# Patient Record
Sex: Female | Born: 1960 | Race: White | Hispanic: No | Marital: Married | Smoking: Former smoker
Health system: Southern US, Community
[De-identification: ages and names within clinical notes are randomized; demographics above are authoritative.]

## PROBLEM LIST (undated history)

## (undated) DIAGNOSIS — R109 Unspecified abdominal pain: Secondary | ICD-10-CM

## (undated) DIAGNOSIS — N898 Other specified noninflammatory disorders of vagina: Secondary | ICD-10-CM

## (undated) DIAGNOSIS — R3129 Other microscopic hematuria: Secondary | ICD-10-CM

## (undated) DIAGNOSIS — R319 Hematuria, unspecified: Secondary | ICD-10-CM

## (undated) DIAGNOSIS — K219 Gastro-esophageal reflux disease without esophagitis: Secondary | ICD-10-CM

## (undated) DIAGNOSIS — G4730 Sleep apnea, unspecified: Secondary | ICD-10-CM

## (undated) DIAGNOSIS — E663 Overweight: Secondary | ICD-10-CM

## (undated) DIAGNOSIS — I10 Essential (primary) hypertension: Secondary | ICD-10-CM

## (undated) DIAGNOSIS — R102 Pelvic and perineal pain: Secondary | ICD-10-CM

## (undated) DIAGNOSIS — N200 Calculus of kidney: Secondary | ICD-10-CM

## (undated) HISTORY — DX: Unspecified abdominal pain: R10.9

## (undated) HISTORY — DX: Hematuria, unspecified: R31.9

## (undated) HISTORY — DX: Gastro-esophageal reflux disease without esophagitis: K21.9

## (undated) HISTORY — DX: Other specified noninflammatory disorders of vagina: N89.8

## (undated) HISTORY — DX: Other microscopic hematuria: R31.29

## (undated) HISTORY — DX: Essential (primary) hypertension: I10

## (undated) HISTORY — DX: Pelvic and perineal pain: R10.2

## (undated) HISTORY — DX: Calculus of kidney: N20.0

## (undated) HISTORY — DX: Sleep apnea, unspecified: G47.30

## (undated) HISTORY — PX: LAPAROSCOPIC HYSTERECTOMY: SHX1926

## (undated) HISTORY — DX: Overweight: E66.3

## (undated) HISTORY — PX: KIDNEY STONE SURGERY: SHX686

---

## 1998-03-16 ENCOUNTER — Other Ambulatory Visit: Admission: RE | Admit: 1998-03-16 | Discharge: 1998-03-16 | Payer: Self-pay | Admitting: Obstetrics & Gynecology

## 1999-06-28 ENCOUNTER — Encounter: Admission: RE | Admit: 1999-06-28 | Discharge: 1999-06-28 | Payer: Self-pay | Admitting: Obstetrics and Gynecology

## 1999-06-28 ENCOUNTER — Encounter: Payer: Self-pay | Admitting: Obstetrics and Gynecology

## 1999-12-18 ENCOUNTER — Encounter (INDEPENDENT_AMBULATORY_CARE_PROVIDER_SITE_OTHER): Payer: Self-pay

## 1999-12-18 ENCOUNTER — Inpatient Hospital Stay (HOSPITAL_COMMUNITY): Admission: RE | Admit: 1999-12-18 | Discharge: 1999-12-20 | Payer: Self-pay | Admitting: Obstetrics and Gynecology

## 2001-12-25 ENCOUNTER — Ambulatory Visit (HOSPITAL_BASED_OUTPATIENT_CLINIC_OR_DEPARTMENT_OTHER): Admission: RE | Admit: 2001-12-25 | Discharge: 2001-12-25 | Payer: Self-pay | Admitting: Family Medicine

## 2002-05-04 ENCOUNTER — Ambulatory Visit (HOSPITAL_BASED_OUTPATIENT_CLINIC_OR_DEPARTMENT_OTHER): Admission: RE | Admit: 2002-05-04 | Discharge: 2002-05-04 | Payer: Self-pay | Admitting: Family Medicine

## 2002-10-22 ENCOUNTER — Emergency Department (HOSPITAL_COMMUNITY): Admission: EM | Admit: 2002-10-22 | Discharge: 2002-10-22 | Payer: Self-pay | Admitting: Emergency Medicine

## 2002-10-22 ENCOUNTER — Encounter: Payer: Self-pay | Admitting: Emergency Medicine

## 2008-11-11 ENCOUNTER — Encounter: Admission: RE | Admit: 2008-11-11 | Discharge: 2008-11-11 | Payer: Self-pay | Admitting: Family Medicine

## 2008-11-16 ENCOUNTER — Encounter: Admission: RE | Admit: 2008-11-16 | Discharge: 2008-11-16 | Payer: Self-pay | Admitting: Family Medicine

## 2010-10-12 NOTE — Discharge Summary (Signed)
[facility]  Patient:    Andrea Odom, Andrea Odom                        MRN: [id number] Adm. Date:  [id number] Disc. Date: [id number] Attending:  Oliver Pila                           Discharge Summary  DISCHARGE MEDICATIONS: 1. Motrin 600 mg p.o. every six hours. 2. Percocet one to two tablets p.o. every four hours p.r.n.  DISCHARGE FOLLOWUP:  Patient is to follow up in our office on December 21, 1999 for staple removal.  HOSPITAL COURSE:  Patient is a 50 year old female who was admitted with a diagnosis of dysmenorrhea, menorrhagia and pelvic pain, who also had some anemia associated with her menstrual cycles.  She desired to proceed with definitive surgical therapy, given probable fibroid uterus versus adenomyosis. She underwent a total abdominal hysterectomy and bilateral salpingo-oophorectomy on December 18, 1999 without complication and was admitted for routine postoperative care.  She did very well postoperatively and on postop day #2, was tolerating a regular diet, passing good flatus, voiding without difficulty and her pain was well-controlled.  Her T-max was 100.1. Her incision was well-approximated without erythema; therefore, she requested to be discharged home if she remained afebrile.  On hospital day #2, she was discharged to home with followup as previously stated in the office for staple removal. DD:  12/20/99 TD:  12/22/99 Job: [id number]

## 2010-10-12 NOTE — Op Note (Signed)
[facility] [place]  Patient:    Andrea Odom, Andrea Odom                        MRN: [id number] Proc. Date: 12/18/99 Adm. Date:  [id number] Attending:  Oliver Pila                           Operative Report  PREOPERATIVE DIAGNOSES:       1. Dysmenorrhea.                               2. Menorrhagia.                               3. Pelvic pain.                               4. Anemia.  POSTOPERATIVE DIAGNOSES:      1. Dysmenorrhea.                               2. Menorrhagia.                               3. Pelvic pain.                               4. Anemia.  OPERATION:                    1. Total abdominal hysterectomy.                               2. Bilateral salpingo-oophorectomy.  SURGEON:                      Alvino Chapel, M.D.  ASSISTANT:                    Malachi Pro. Ambrose Mantle, M.D.  ANESTHESIA:                   General endotracheal.  ESTIMATED BLOOD LOSS:         350 cc  URINE OUTPUT:                 Approximately 200 cc clear urine  IV FLUIDS:                    1700 cc lactated Ringers.  FINDINGS:                     The right ovary was within normal limits. Fallopian tubes bilaterally were slightly adhesed.  The left ovary had an approximately 2 cm simple cyst, otherwise was within normal limits.  Uterus was approximately 8 to 10 weeks size.  There was some scarring of the bladder flap onto the anterior surface of the uterus.  DESCRIPTION OF PROCEDURE:     The patient was taken to the operating room where general anesthesia was obtained without difficulty.  She was then prepped and draped in the normal sterile fashion in  the dorsal supine position.  Transverse incision was made on the abdomen, approximately 2 cm above the symphysis pubis and carried through to the underlying layer of fascia by sharp dissection and Bovie cautery.  The fascia was then nicked in the midline with Bovie and extended laterally with Mayo scissors.   The superior aspect of the incision was grasped with Kocher clamps, elevated and the underlying rectus muscles were dissected away with Bovie cautery.  On the patients right, the rectus muscle was quite adherent to the fascia from her previous surgery.  The inferior aspect of the fascia was then grasped with Kocher clamps, elevated and dissected off to underlying rectus muscles.  The abdominal cavity was then entered bluntly between the rectus muscles and the peritoneal incision extended both superiorly and inferiorly with careful attention to avoid the bowel or bladder.  The Balfour retractor was then placed within the incision and the bowel packed away with moist laparotomy sponges.  Pelvis was inspected with findings as previously stated.  The slightly curved long Kellys were placed on each uterine cornu and the uterus elevated to the level of the incision.  Attention was turned to the patients left round ligament which was transected with Bovie cautery and the retroperitoneal space opened.  This was carried over to the midline of the uterus to begin to create a bladder flap.  The infundibulopelvic ligament was then isolated and slightly curved up and clamp placed on it. This was then transected and secured with both free tie and suture ligature of 0 Vicryl. Attention was then turned to the patients right where in a similar fashion, the round ligament was transected with Bovie cautery.  Retroperitoneal space opened.  The infundibulopelvic ligament was isolated, clamped with a slightly curved clamp and transected.  This was then likewise secured with free tie and suture ligature of 0 Vicryl.  The uterine artery was then skeletonized on the patients right and some remaining scar tissue of the bladder flap to the lower uterine segment was taken down with Metzenbaum scissors.  The uterine artery on the patients right was skeletonized in a similar fashion.  The uterine arteries were then  clamped with slightly curved clamp bilaterally, transected and suture ligated with 0 Vicryl.  The remainder of the cardinal and broad ligaments were then taken down sequentially with straight clamps along the lateral aspect of the cervix down to the level of the external os. Each step was secured with a suture of 0 Vicryl.  The vaginal cuff was then entered with a right angle clamp on the cuff angle and the uterus was amputated with Jorgenson scissors.  The vaginal cuff was grasped with Kocher clamps and each angle was secured with 0 Vicryl.  The remaining cuff was closed with figure-of-eight sutures of 0 Vicryl.  The abdomen and pelvis were then irrigated and all pedicles inspected and found to be hemostatic.  The ureters were then identified bilaterally and found to be of normal caliber. With all pedicles and the cuff hemostatic, all sutures and instruments were removed from the abdomen as well as laparotomy sponges.  A Balfour retractor was removed from the abdomen.  The rectus muscle was carefully inspected and several areas of bleeding were cauterized with Bovie cautery.  When this was hemostatic the fascia was closed with 0 Vicryl in a running fashion.  The subcuticular tissue was reapproximated with 1-0 Vicryl on a CTX needle and finally the skin was closed with staples.  Sponge, lap and needle  counts were correct times two and the patient was taken to the recovery room in stable condition. DD:  12/18/99 TD:  12/20/99 Job: [id number]

## 2010-10-12 NOTE — H&P (Signed)
[facility]  Patient:    Andrea Odom, Andrea Odom                          MRN: [id number] Adm. Date:  12/18/99 Attending:  Alvino Chapel, M.D.                         History and Physical  HISTORY OF PRESENT ILLNESS:  Patient is a 50 year old G2, P2 with an approximately two year history of menorrhagia and pelvic pain which had become quite a handicap, interfering with work on a monthly basis.  Patient reports dysmenorrhea with each menstrual cycle that is significant enough to keep her out of work.  Initially the patient was seen for this problem in January of 2001 and was placed on oral contraceptives with a tentative diagnosis of endometriosis which the patient has had a history of in the past.  The oral contraceptives caused the patient to have a significant exacerbation of her migraine headaches and did not have significant improvement in her menstrual cycles.  Patient has been anemic with a hemoglobin of 10.5 at two subsequent office visits secondary to her menorrhagia.  Therefore the plan was made to proceed with definitive surgical therapy.  PAST SURGICAL HISTORY:  Significant for: 1. Laparoscopy approximately 10 years ago in which endometriosis was    identified. 2. The patient had two cesarean sections as well as a bilateral tubal ligation    with her last cesarean section.  PAST OBSTETRICAL HISTORY:  C-section x 2 as above.  PAST GYNECOLOGICAL HISTORY:  There are no abnormal Pap smears; however, a history of endometriosis.  PAST MEDICAL HISTORY:  As stated, the migraine headaches which are worse on oral contraceptives.  MEDICATIONS: 1. Prozac. 2. Occasional antibiotics for sinus infections.  ALLERGIES:  She has no known drug allergies.  FAMILY HISTORY:  There is a family history of a maternal aunt having breast cancer.  PHYSICAL EXAMINATION:  VITAL SIGNS:  Patients weight is 175 pounds, blood pressure is 118/80.  BREASTS:  Breast  exam had right breast mass which was subsequently identified to be benign on screening sonogram and mammogram consistent with fibrocystic changes.  Left breast was without masses or adenopathy.  CARDIAC:  Regular rate and rhythm.  LUNGS:  Clear to auscultation bilaterally.  ABDOMEN:  Soft and nontender.  PELVIC:  She had normal external genitalia.  Cervix was posterior.  Uterus was upper limits of normal and slightly boggy in mid position.  The adnexae had no significant masses.  As previously stated the patient was placed on a six month trial of Loestrin with only slight improvement in her pelvic pain and dysmenorrhea and a significant exacerbation of her migraine headaches. Multiple options were discussed with the patient; however, given her migraine headaches, she desired to proceed with definitive surgical therapy and was counseled for a hysterectomy and bilateral salpingo-oophorectomy.  All risks and benefits of the surgery were discussed with the patient including a risk of bleeding, infection, possible damage to bowel and bladder.  Patient understands these risks and desires to proceed with surgery. DD:  12/14/99 TD:  12/16/99 Job: [id number]

## 2014-03-15 ENCOUNTER — Ambulatory Visit: Payer: Self-pay | Admitting: Urology

## 2014-04-12 ENCOUNTER — Ambulatory Visit: Payer: Self-pay | Admitting: Urology

## 2014-04-12 LAB — CBC WITH DIFFERENTIAL/PLATELET
Basophil #: 0.1 10*3/uL (ref 0.0–0.1)
Basophil %: 1.3 %
Eosinophil #: 0.2 10*3/uL (ref 0.0–0.7)
Eosinophil %: 3.3 %
HCT: 39.7 % (ref 35.0–47.0)
HGB: 13.6 g/dL (ref 12.0–16.0)
Lymphocyte #: 1.6 10*3/uL (ref 1.0–3.6)
Lymphocyte %: 32.5 %
MCH: 31.9 pg (ref 26.0–34.0)
MCHC: 34.2 g/dL (ref 32.0–36.0)
MCV: 93 fL (ref 80–100)
Monocyte #: 0.6 x10 3/mm (ref 0.2–0.9)
Monocyte %: 12.1 %
Neutrophil #: 2.4 10*3/uL (ref 1.4–6.5)
Neutrophil %: 50.8 %
Platelet: 245 10*3/uL (ref 150–440)
RBC: 4.25 10*6/uL (ref 3.80–5.20)
RDW: 12.4 % (ref 11.5–14.5)
WBC: 4.8 10*3/uL (ref 3.6–11.0)

## 2014-04-18 ENCOUNTER — Ambulatory Visit: Payer: Self-pay | Admitting: Urology

## 2014-05-24 ENCOUNTER — Ambulatory Visit: Payer: Self-pay | Admitting: Urology

## 2014-09-17 NOTE — Op Note (Signed)
PATIENT NAME:  Andrea Odom, Andrea Odom MR#:  [id number] DATE OF BIRTH:  [birth year]  DATE OF PROCEDURE:  04/18/2014  PREOPERATIVE DIAGNOSIS: Left renal stone.   POSTOPERATIVE DIAGNOSIS: Left renal stone.  PROCEDURE PERFORMED: Left ureteroscopy, laser lithotripsy, left ureteral stent placement on string.   SURGEON: Hollice Espy, MD  ANESTHESIA: General.  SPECIMENS: None.   DRAINS: A 6 x 22 French double-J ureteral stent on left.   COMPLICATIONS: None.   INDICATION: This is a 54 year old female with a history of recurrent nephrolithiasis who underwent microscopic hematuria work-up who was found to have left lower pole stones. She also has left flank pain which she feels is related to her stones, although these are nonobstructing on CT scan. Risks and benefits of the procedure were explained in detail. The patient agreed to proceed as planned.   PROCEDURE IN DETAIL: The patient was correctly identified in the prep holding area and informed consent was obtained. She was brought to the operating suite and placed on the table in the supine position. At this time, universal timeout protocol was performed. All team members were identified. Venodyne boots were placed and she was administered IV Levaquin in the perioperative period. She was then placed under general anesthesia and intubated, repositioned lower on the bed, then in the dorsal lithotomy position, and prepped and draped in standard surgical fashion. A 22-French cystoscope was advanced per urethra into the bladder and attention was turned to the left ureteral orifice which was cannulated using a 5-French open-ended ureteral catheter. A retrograde pyelogram was then performed which revealed a delicate-appearing ureter without filling defects and no evidence of hydronephrosis within the kidney. A Sensor wire was then advanced up to the level of the kidney and the scope was then removed. Dual-lumen access sheath was advanced into the distal ureter and a  second Sensor wire was advanced to the level of the kidney. One was snapped in place with a safety wire and the second was used as a working wire. At this point in time, I did try to advance a flexible ureteroscope over the wire, but met resistance at the UO. I therefore elected to use an access sheath, Lacinda Axon 12/14 size, which did advance quite easily to the level of the proximal ureter and the inner cannula was removed. The scope was then advanced through the sheath up to the level of the renal pelvis. A small stone was encountered in the lower pole, at the site of the stone on CT scan, although it appeared to be only the tip of the stone with the remainder of the stone embedded deeply within the tissue, much like an ice berg. A 273 micron laser fiber was then used using the settings of 0.2 joules and 50 Hz to fragment the tip of the stone off; however, the stone embedded within the tissue was unable to be treated. The remainder of the calyces were directly visualized and a repeat retrograde pyelogram was performed through the scope ensuring that all calyces had indeed been visually inspected and were noted to be free of stone. Once this was performed, the case was deemed complete and the access sheath was scoped out under direct visualization ensuring that there was no injury to the ureter or stones, which was confirmed. A 6 x 22 French double-J ureteral stent was then advanced over the safety wire up to the level of the renal pelvis under fluoroscopic guidance. The wire was partially withdrawn and coil was noted within the renal pelvis. The wire  was then fully withdrawn and a coil was noted within the bladder under fluoroscopic guidance. The 22-French access sheath, at the scope, was then used to drain the bladder and the string of the stent was secured to the patient's right inner thigh using Mastisol and Tegaderm. The patient was then repositioned in the supine position, reversed from anesthesia and taken to the  PACU in stable condition. There were no complications in this case.   ____________________________ Sherlynn Stalls, MD ajb:sb D: 04/18/2014 10:45:43 ET T: 04/18/2014 11:28:27 ET JOB#: [id number]  cc: Sherlynn Stalls, MD, <Dictator> Sherlynn Stalls MD ELECTRONICALLY SIGNED 05/11/2014 10:49

## 2015-01-03 ENCOUNTER — Other Ambulatory Visit: Payer: Self-pay

## 2015-01-03 DIAGNOSIS — N200 Calculus of kidney: Secondary | ICD-10-CM

## 2015-06-02 ENCOUNTER — Ambulatory Visit
Admission: RE | Admit: 2015-06-02 | Discharge: 2015-06-02 | Disposition: A | Discharge status: Home / Self Care | Payer: BLUE CROSS/BLUE SHIELD | Source: Ambulatory Visit | Attending: Urology | Admitting: Urology

## 2015-06-02 ENCOUNTER — Ambulatory Visit (INDEPENDENT_AMBULATORY_CARE_PROVIDER_SITE_OTHER): Payer: BLUE CROSS/BLUE SHIELD | Admitting: Obstetrics and Gynecology

## 2015-06-02 ENCOUNTER — Ambulatory Visit: Payer: BLUE CROSS/BLUE SHIELD | Admitting: Urology

## 2015-06-02 ENCOUNTER — Encounter: Payer: Self-pay | Admitting: Obstetrics and Gynecology

## 2015-06-02 VITALS — BP 151/94 | HR 90 | Ht 62.0 in | Wt 193.0 lb

## 2015-06-02 DIAGNOSIS — N200 Calculus of kidney: Secondary | ICD-10-CM | POA: Insufficient documentation

## 2015-06-02 LAB — MICROSCOPIC EXAMINATION
Bacteria, UA: NONE SEEN
RBC, UA: NONE SEEN /hpf (ref 0–?)

## 2015-06-02 LAB — URINALYSIS, COMPLETE
BILIRUBIN UA: NEGATIVE
GLUCOSE, UA: NEGATIVE
Ketones, UA: NEGATIVE
Nitrite, UA: NEGATIVE
PROTEIN UA: NEGATIVE
Specific Gravity, UA: <1.005 — ABNORMAL LOW (ref 1.005–1.030)
UUROB: 0.2 mg/dL (ref 0.2–1.0)
pH, UA: 6.5 (ref 5.0–7.5)

## 2015-06-02 NOTE — Progress Notes (Signed)
06/02/2015 3:13 PM   Andrea Odom [birth year] [id number]  Referring provider: Marin Comment, FNP [address] [place], [place] [address]  Chief Complaint  Patient presents with  . Nephrolithiasis    1year with KUB    HPI:  Patient is a 55 year old female who presents today for her annual follow-up. He has a history of renal stones with known bilateral nephrolithiasis. She underwent left URS with laser lithotripsy and left ureteral stent placement in November of 19147 a large lower pole left stone.  Intraoperatively the stone was noted to be significant and guided into the tissue and just the portion of the stone which was showing was treated. Patient reports no further left-sided flank pain since her surgery.  Does report that she did pass a renal stone in August. She reports that she was seen at the [facility] but no imaging studies were performed. She reports that she has had one urinary tract infection over the last year. She brought her stone with her today for analysis. Denies any urinary symptoms today or flank pain. KUB was performed prior to this appointment.  KUB FINDINGS: The bowel gas pattern is normal. At least 2 small left renal calculi are noted. No definite calcifications are noted on the right.  IMPRESSION: Left nephrolithiasis is noted. There is no evidence of bowel obstruction or ileus.   PMH: Past Medical History  Diagnosis Date  . Kidney stone on left side   . Sleep apnea   . Acid reflux   . Hematuria   . Vaginal discharge   . Bilateral kidney stones   . Hypertension   . Vaginal pain   . Abdominal pain   . Left flank pain   . Over weight   . Microscopic hematuria     Surgical History: Past Surgical History  Procedure Laterality Date  . Cesarean section    . Laparoscopic hysterectomy      Home Medications:    Medication List       This list is accurate as of: 06/02/15  3:13 PM.  Always use your most recent med  list.               EXCEDRIN SINUS HEADACHE PO  Take by mouth.     omeprazole 20 MG capsule  Commonly known as:  PRILOSEC  Take 20 mg by mouth.        Allergies: No Known Allergies  Family History: Family History  Problem Relation Age of Onset  . Heart disease Father   . Hematuria Father   . Bladder Cancer Neg Hx   . Prostate cancer Neg Hx   . Kidney cancer Neg Hx     Social History:  reports that she has quit smoking. She does not have any smokeless tobacco history on file. She reports that she drinks alcohol. She reports that she does not use illicit drugs.  ROS: UROLOGY Frequent Urination?: No Hard to postpone urination?: No Burning/pain with urination?: No Get up at night to urinate?: No Leakage of urine?: No Urine stream starts and stops?: No Trouble starting stream?: No Do you have to strain to urinate?: No Blood in urine?: No Urinary tract infection?: No Sexually transmitted disease?: No Injury to kidneys or bladder?: No Painful intercourse?: No Weak stream?: No Currently pregnant?: No Vaginal bleeding?: No Last menstrual period?: n  Gastrointestinal Nausea?: No Vomiting?: No Indigestion/heartburn?: No Diarrhea?: No Constipation?: No  Constitutional Fever: No Night sweats?: No Weight loss?: No Fatigue?: No  Skin Skin rash/lesions?: No Itching?: No  Eyes Blurred vision?: No Double vision?: No  Ears/Nose/Throat Sore throat?: No Sinus problems?: No  Hematologic/Lymphatic Swollen glands?: No Easy bruising?: No  Cardiovascular Leg swelling?: No Chest pain?: No  Respiratory Cough?: No Shortness of breath?: No  Endocrine Excessive thirst?: No  Musculoskeletal Back pain?: No Joint pain?: No  Neurological Headaches?: Yes Dizziness?: No  Psychologic Depression?: No Anxiety?: No  Physical Exam: BP 151/94 mmHg  Pulse 90  Ht 5\' 2"  (1.575 m)  Wt 193 lb (87.544 kg)  BMI 35.29 kg/m2  Constitutional:  Alert and  oriented, No acute distress. HEENT: [place] AT, moist mucus membranes.  Trachea midline, no masses. GU: No CVA tenderness.  Skin: No rashes, bruises or suspicious lesions. Lymph: No cervical or inguinal adenopathy. Neurologic: Grossly intact, no focal deficits, moving all 4 extremities. Psychiatric: Normal mood and affect.  Laboratory Data:   Urinalysis    Component Value Date/Time   GLUCOSEU Negative 06/02/2015 1100   BILIRUBINUR Negative 06/02/2015 1100   NITRITE Negative 06/02/2015 1100   LEUKOCYTESUR Trace* 06/02/2015 1100    Pertinent Imaging: CLINICAL DATA: Right nephrolithiasis.  EXAM: ABDOMEN - 1 VIEW  COMPARISON: CT scan of March 15, 2014.  FINDINGS: The bowel gas pattern is normal. At least 2 small left renal calculi are noted. No definite calcifications are noted on the right.  IMPRESSION: Left nephrolithiasis is noted. There is no evidence of bowel obstruction or ileus.  Electronically Signed  By: Lupita RaiderJames Green Jr, M.D.  On: 06/02/2015 11:50  Assessment & Plan:    1. Nephrolithiasis- Pasted right sided stone in August and brought it with her today. Feeling well.  - Stone Analysis- will call with results - Urinalysis, Complete -1 year f/u with KUB prior  Return in about 1 year (around 06/01/2016) for KUB prior.  These notes generated with voice recognition software. I apologize for typographical errors.  Earlie LouLindsay Leonila Speranza, FNP  [facility] [address], [address] [place], [place] [address] [phone]

## 2015-06-16 ENCOUNTER — Encounter: Payer: Self-pay | Admitting: Obstetrics and Gynecology

## 2016-06-07 ENCOUNTER — Ambulatory Visit: Payer: BLUE CROSS/BLUE SHIELD | Admitting: Urology

## 2016-07-11 ENCOUNTER — Telehealth: Payer: Self-pay | Admitting: Urology

## 2016-07-11 NOTE — Telephone Encounter (Signed)
Need order for KUB for this patient to get prior to her upcoming app  Thanks  michelle

## 2016-07-31 ENCOUNTER — Ambulatory Visit
Admission: RE | Admit: 2016-07-31 | Discharge: 2016-07-31 | Disposition: A | Discharge status: Home / Self Care | Payer: BLUE CROSS/BLUE SHIELD | Source: Ambulatory Visit | Attending: Urology | Admitting: Urology

## 2016-07-31 ENCOUNTER — Encounter: Payer: Self-pay | Admitting: Urology

## 2016-07-31 ENCOUNTER — Ambulatory Visit: Payer: BLUE CROSS/BLUE SHIELD | Admitting: Urology

## 2016-07-31 VITALS — BP 156/82 | HR 84 | Ht 62.0 in | Wt 203.0 lb

## 2016-07-31 DIAGNOSIS — N200 Calculus of kidney: Secondary | ICD-10-CM

## 2016-07-31 NOTE — Progress Notes (Signed)
07/31/2016 9:26 AM   Andrea Odom Mar 15, 1961 [id number]  Referring provider: Marin Commentheryl Wells, FNP [address] [place], [place] [id number]  Chief Complaint  Patient presents with  . Nephrolithiasis    1year w/KUB    HPI:  56 year old female with a history of nephrolithiasis who returns today for annual follow-up.  She underwent left ureteroscopy, laser lithotripsy in November 2015 for a large lower pole stone.  At that time, the stone was noted to be deeply imbedded in the renal parenchyma.  Follow up KUB showed expected small left renal calcifications.  KUB today shows stable left lower pole nephrolithiasis, unchanged from one year ago. This likely represents the and divided parenchymal stone appreciated on ureteroscopy in 2015.  She denies any interval stone episodes, flank pain, or gross hematuria. She denies any voiding symptoms today. Overall, she's doing fairly well.  She admits to not drinking enough fluids at the day.  PMH: Past Medical History:  Diagnosis Date  . Abdominal pain   . Acid reflux   . Bilateral kidney stones   . Hematuria   . Hypertension   . Kidney stone on left side   . Left flank pain   . Microscopic hematuria   . Over weight   . Sleep apnea   . Vaginal discharge   . Vaginal pain     Surgical History: Past Surgical History:  Procedure Laterality Date  . CESAREAN SECTION    . LAPAROSCOPIC HYSTERECTOMY      Home Medications:  Allergies as of 07/31/2016   No Known Allergies     Medication List       Accurate as of 07/31/16 11:59 PM. Always use your most recent med list.          omeprazole 20 MG capsule Commonly known as:  PRILOSEC Take 20 mg by mouth.       Allergies: No Known Allergies  Family History: Family History  Problem Relation Age of Onset  . Heart disease Father   . Hematuria Father   . Bladder Cancer Neg Hx   . Prostate cancer Neg Hx   . Kidney cancer Neg Hx     Social History:  reports that she has quit  smoking. She has never used smokeless tobacco. She reports that she drinks alcohol. She reports that she does not use drugs.  ROS: UROLOGY Frequent Urination?: No Hard to postpone urination?: No Burning/pain with urination?: No Get up at night to urinate?: No Leakage of urine?: No Urine stream starts and stops?: No Trouble starting stream?: No Do you have to strain to urinate?: No Blood in urine?: No Urinary tract infection?: No Sexually transmitted disease?: No Injury to kidneys or bladder?: No Painful intercourse?: No Weak stream?: No Currently pregnant?: No Vaginal bleeding?: No Last menstrual period?: n  Gastrointestinal Nausea?: No Vomiting?: No Indigestion/heartburn?: No Diarrhea?: No Constipation?: No  Constitutional Fever: No Night sweats?: No Weight loss?: No Fatigue?: No  Skin Skin rash/lesions?: No Itching?: No  Eyes Blurred vision?: No Double vision?: No  Ears/Nose/Throat Sore throat?: No Sinus problems?: Yes  Hematologic/Lymphatic Swollen glands?: No Easy bruising?: No  Cardiovascular Leg swelling?: No Chest pain?: No  Respiratory Cough?: Yes Shortness of breath?: No  Endocrine Excessive thirst?: No  Musculoskeletal Back pain?: No Joint pain?: No  Neurological Headaches?: No Dizziness?: No  Psychologic Depression?: No Anxiety?: No  Physical Exam: BP (!) 156/82   Pulse 84   Ht 5\' 2"  (1.575 m)   Wt 203 lb (  92.1 kg)   BMI 37.13 kg/m   Constitutional:  Alert and oriented, No acute distress. HEENT: [place] AT, moist mucus membranes.  Trachea midline, no masses. Cardiovascular: No clubbing, cyanosis, or edema. Respiratory: Normal respiratory effort, no increased work of breathing. GI: Abdomen is soft, nontender, nondistended, no abdominal masses GU: No CVA tenderness.  Skin: No rashes, bruises or suspicious lesions. Neurologic: Grossly intact, no focal deficits, moving all 4 extremities. Psychiatric: Normal mood and  affect.  Laboratory Data: Lab Results  Component Value Date   WBC 4.8 04/12/2014   HGB 13.6 04/12/2014   HCT 39.7 04/12/2014   MCV 93 04/12/2014   PLT 245 04/12/2014   Cr 0.8 on 5/616  Urinalysis    Component Value Date/Time   APPEARANCEUR Clear 06/02/2015 1100   GLUCOSEU Negative 06/02/2015 1100   BILIRUBINUR Negative 06/02/2015 1100   PROTEINUR Negative 06/02/2015 1100   NITRITE Negative 06/02/2015 1100   LEUKOCYTESUR Trace (A) 06/02/2015 1100    Pertinent Imaging: CLINICAL DATA:  Nephrolithiasis  EXAM: ABDOMEN - 1 VIEW  COMPARISON:  06/02/2015  FINDINGS: Small calcifications project over the lower pole of the left kidney, unchanged. No other suspicious calcifications. No organomegaly. No evidence of bowel obstruction or free air.  IMPRESSION: Left lower pole nephrolithiasis, stable.   Electronically Signed   By: Charlett Nose M.D.   On: 07/31/2016 11:02  KUB personally reviewed today compared to previous KUB approximately one year ago.  Assessment & Plan:    1. Nephrolithiasis Stable left sided stone burden, likely represents embedded ureteral stone previously observed on ureteroscopy in 2015 Otherwise asymptomatic Stone precautions reviewed stone diet reviewed Recommend follow-up in 2 years with KUB to assess for stone stability - Abdomen 1 view (KUB); Future   Return in about 2 years (around 08/01/2018) for KUB.  Vanna Scotland, MD  [facility] [address], [address] [place], [place] [address] [phone]

## 2017-03-20 ENCOUNTER — Other Ambulatory Visit: Payer: Self-pay | Admitting: Family Medicine

## 2017-03-20 DIAGNOSIS — N6320 Unspecified lump in the left breast, unspecified quadrant: Secondary | ICD-10-CM

## 2017-09-01 ENCOUNTER — Other Ambulatory Visit: Payer: Self-pay | Admitting: Family Medicine

## 2017-09-01 DIAGNOSIS — M7989 Other specified soft tissue disorders: Secondary | ICD-10-CM

## 2017-09-01 DIAGNOSIS — M79671 Pain in right foot: Secondary | ICD-10-CM

## 2017-09-03 ENCOUNTER — Other Ambulatory Visit: Payer: BLUE CROSS/BLUE SHIELD

## 2017-09-05 ENCOUNTER — Ambulatory Visit
Admission: RE | Admit: 2017-09-05 | Discharge: 2017-09-05 | Disposition: A | Discharge status: Home / Self Care | Payer: BLUE CROSS/BLUE SHIELD | Source: Ambulatory Visit | Attending: Family Medicine | Admitting: Family Medicine

## 2017-09-05 DIAGNOSIS — M79671 Pain in right foot: Secondary | ICD-10-CM

## 2017-09-05 DIAGNOSIS — M7989 Other specified soft tissue disorders: Secondary | ICD-10-CM

## 2018-04-02 ENCOUNTER — Other Ambulatory Visit: Payer: Self-pay

## 2018-04-02 ENCOUNTER — Ambulatory Visit (HOSPITAL_COMMUNITY)
Admission: EM | Admit: 2018-04-02 | Discharge: 2018-04-02 | Disposition: A | Discharge status: Home / Self Care | Payer: BLUE CROSS/BLUE SHIELD | Attending: Internal Medicine | Admitting: Internal Medicine

## 2018-04-02 ENCOUNTER — Encounter (HOSPITAL_COMMUNITY): Payer: Self-pay

## 2018-04-02 DIAGNOSIS — S61213A Laceration without foreign body of left middle finger without damage to nail, initial encounter: Secondary | ICD-10-CM

## 2018-04-02 MED ORDER — LIDOCAINE HCL 2 % IJ SOLN
INTRAMUSCULAR | Status: AC
  Filled 2018-04-02: qty 20

## 2018-04-02 NOTE — Discharge Instructions (Signed)
3 sutures placed. You can remove current dressing in 24 hours. Keep wound clean and dry. You can clean gently with soap and water. Do not soak area in water. Monitor for spreading redness, increased warmth, increased swelling, fever, follow up for reevaluation needed. Otherwise follow up in 7 days for suture removal.  ° °

## 2018-04-02 NOTE — ED Provider Notes (Signed)
[facility]    CSN: [id number] Arrival date & time: 04/02/18  1652     History   Chief Complaint Chief Complaint  Patient presents with  . Laceration    HPI Andrea Odom is a 57 y.o. female.   57 year old female comes in for laceration to the right middle finger at the distal aspect.  States was using a razor to cut pipe, when she sustained a laceration.  Leading controlled with pressure.  Denies numbness, tingling.  States she washed the wound with water, applied antibiotic ointment, and came in for evaluation.  Tetanus up-to-date.     Past Medical History:  Diagnosis Date  . Abdominal pain   . Acid reflux   . Bilateral kidney stones   . Hematuria   . Hypertension   . Kidney stone on left side   . Left flank pain   . Microscopic hematuria   . Over weight   . Sleep apnea   . Vaginal discharge   . Vaginal pain     There are no active problems to display for this patient.   Past Surgical History:  Procedure Laterality Date  . CESAREAN SECTION    . LAPAROSCOPIC HYSTERECTOMY      OB History   None      Home Medications    Prior to Admission medications   Medication Sig Start Date End Date Taking? Authorizing Provider  omeprazole (PRILOSEC) 20 MG capsule Take 20 mg by mouth.    [provider]    Family History Family History  Problem Relation Age of Onset  . Heart disease Father   . Hematuria Father   . Bladder Cancer Neg Hx   . Prostate cancer Neg Hx   . Kidney cancer Neg Hx     Social History Social History   Tobacco Use  . Smoking status: Former Games developer  . Smokeless tobacco: Never Used  Substance Use Topics  . Alcohol use: Yes    Alcohol/week: 0.0 standard drinks  . Drug use: No     Allergies   Patient has no known allergies.   Review of Systems Review of Systems  Reason unable to perform ROS: See HPI as above.     Physical Exam Triage Vital Signs ED Triage Vitals  Enc Vitals Group     BP 04/02/18  1703 (!) 172/97     Pulse Rate 04/02/18 1703 92     Resp 04/02/18 1703 15     Temp 04/02/18 1703 98.1 F (36.7 C)     Temp Source 04/02/18 1703 Oral     SpO2 04/02/18 1703 100 %     Weight 04/02/18 1704 179 lb (81.2 kg)     Height --      Head Circumference --      Peak Flow --      Pain Score 04/02/18 1704 6     Pain Loc --      Pain Edu? --      Excl. in GC? --    No data found.  Updated Vital Signs BP (!) 172/97 (BP Location: Right Arm)   Pulse 92   Temp 98.1 F (36.7 C) (Oral)   Resp 15   Wt 179 lb (81.2 kg)   SpO2 100%   BMI 32.74 kg/m   Physical Exam  Constitutional: She is oriented to person, place, and time. She appears well-developed and well-nourished. No distress.  HENT:  Head: Normocephalic and atraumatic.  Eyes: Pupils are  equal, round, and reactive to light. Conjunctivae are normal.  Musculoskeletal:  U shaped laceration to the medial aspect of right middle finger at the finger tip. Bleeding controlled with pressure. Full ROM of finger. Sensation intact, cap refill <2s  Neurological: She is alert and oriented to person, place, and time.  Skin: She is not diaphoretic.     UC Treatments / Results  Labs (all labs ordered are listed, but only abnormal results are displayed) Labs Reviewed - No data to display  EKG None  Radiology No results found.  Procedures Laceration Repair Date/Time: 04/03/2018 1:08 PM Performed by: Belinda Fisher, PA-C Authorized by: Isa Rankin, MD   Consent:    Consent obtained:  Verbal   Consent given by:  Patient   Risks discussed:  Infection, pain, poor cosmetic result and poor wound healing   Alternatives discussed:  No treatment Anesthesia (see MAR for exact dosages):    Anesthesia method:  Nerve block and local infiltration   Local anesthetic:  Lidocaine 2% w/o epi   Block location:  Left middle finger   Block needle gauge:  27 G   Block anesthetic:  Lidocaine 2% w/o epi   Block injection procedure:   Anatomic landmarks identified, introduced needle, incremental injection, anatomic landmarks palpated and negative aspiration for blood   Block outcome:  Incomplete block Laceration details:    Location:  Finger   Finger location:  L long finger   Wound length (cm): 2cm U shaped.   Laceration depth: 2. Repair type:    Repair type:  Simple Pre-procedure details:    Preparation:  Patient was prepped and draped in usual sterile fashion Exploration:    Hemostasis achieved with:  Direct pressure and tourniquet   Wound exploration: wound explored through full range of motion and entire depth of wound probed and visualized   Treatment:    Area cleansed with:  Hibiclens   Amount of cleaning:  Standard   Irrigation solution:  Sterile saline   Irrigation method:  Pressure wash and tap   Visualized foreign bodies/material removed: no   Skin repair:    Repair method:  Sutures   Suture size:  3-0   Suture material:  Prolene   Suture technique:  Simple interrupted   Number of sutures:  3 Post-procedure details:    Dressing:  Antibiotic ointment and bulky dressing   Patient tolerance of procedure:  Tolerated well, no immediate complications   (including critical care time)  Medications Ordered in UC Medications - No data to display  Initial Impression / Assessment and Plan / UC Course  I have reviewed the triage vital signs and the nursing notes.  Pertinent labs & imaging results that were available during my care of the patient were reviewed by me and considered in my medical decision making (see chart for details).    Discussed laceration repair options, Steri-Strips versus Dermabond versus sutures.  Patient states work requires frequent washing of hands, and the sutures may be better.  Risks and benefits discussed, patient would like to proceed with sutures.  Patient tolerated procedure well. 3 sutures placed. Wound care instructions given. Return precautions given. Otherwise, follow up  in 7 days for suture removal. Patient expresses understanding and agrees to plan.   Final Clinical Impressions(s) / UC Diagnoses   Final diagnoses:  Laceration of left middle finger without foreign body without damage to nail, initial encounter    ED Prescriptions    None  Belinda Fisher, PA-C 04/03/18 1311

## 2018-04-02 NOTE — ED Triage Notes (Signed)
Pt cc she has a laceration to her left hand middle finger. This happen today.

## 2018-04-03 DIAGNOSIS — S61213A Laceration without foreign body of left middle finger without damage to nail, initial encounter: Secondary | ICD-10-CM | POA: Diagnosis not present

## 2018-07-08 IMAGING — CT CT FOOT*R* W/O CM
2 series · 10 of 14 positions shown, 12 images · non-contrast
Comparison: None.

CLINICAL DATA: Right foot pain for 4 months. History of plantar
fasciitis. No known injury.

EXAM:
CT OF THE RIGHT FOOT WITHOUT CONTRAST
TECHNIQUE: Multidetector CT imaging of the right foot was performed according
to the standard protocol. Multiplanar CT image reconstructions were
also generated.

[Series 4: soft tissue lower extremity · axial · 0.54mm/px · z∈[+245,+333]mm · 5 of 68 slices shown]
[im 12/68  soft-tissue]
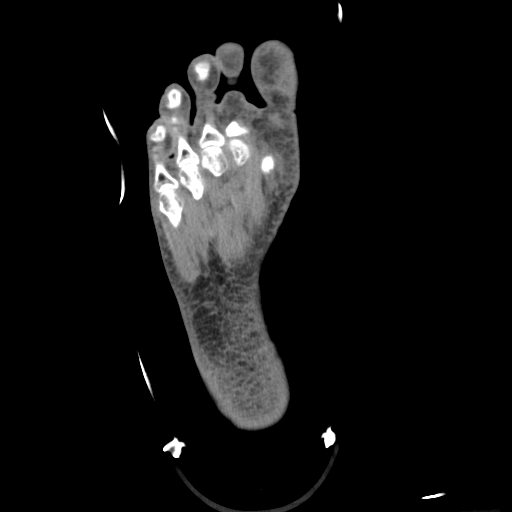
[im 23/68  soft-tissue]
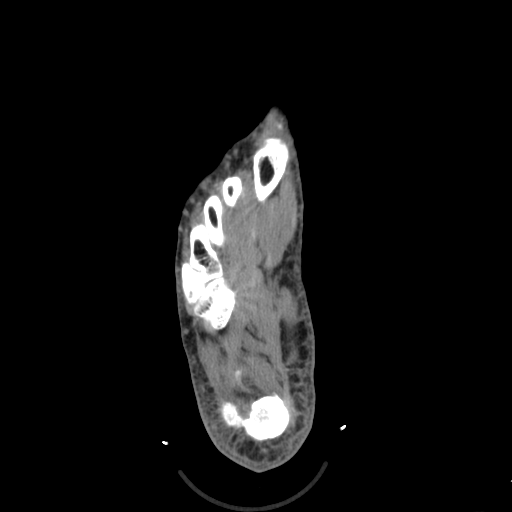
[im 34/68  soft-tissue]
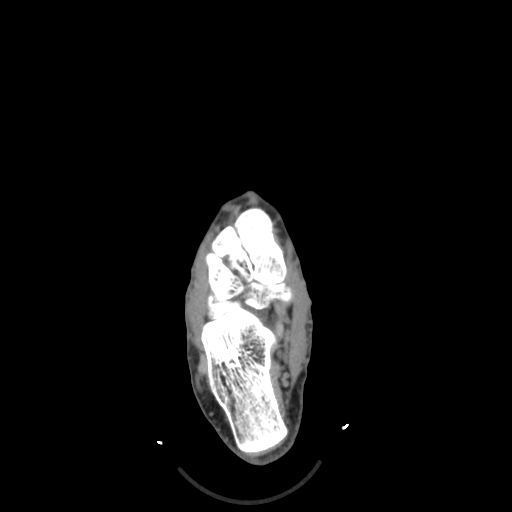
[im 45/68  soft-tissue]
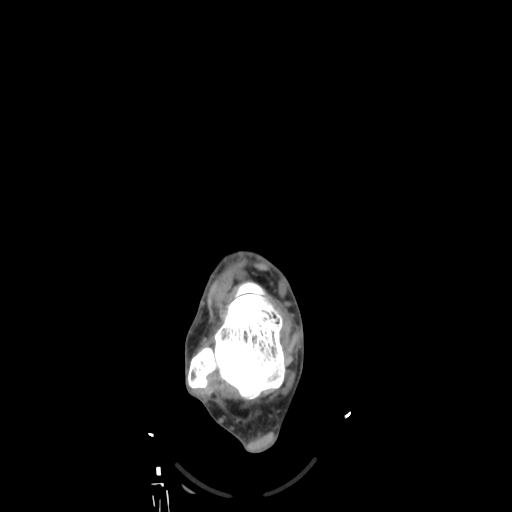
[im 56/68  soft-tissue]
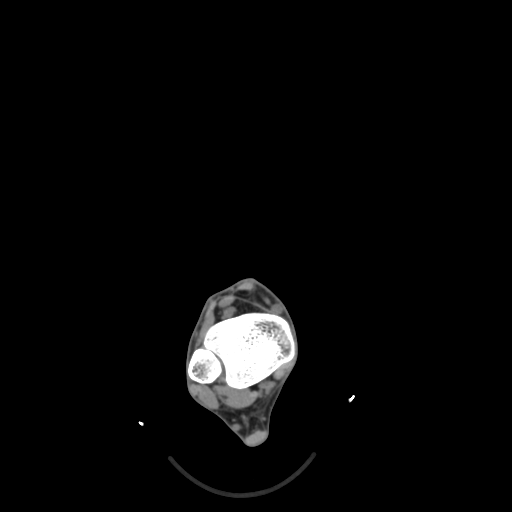

[Series 603: long soft · axial · 0.54mm/px · z∈[+185,+261]mm · 5 of 64 slices shown, 7 images]
[im 11/64  soft-tissue]
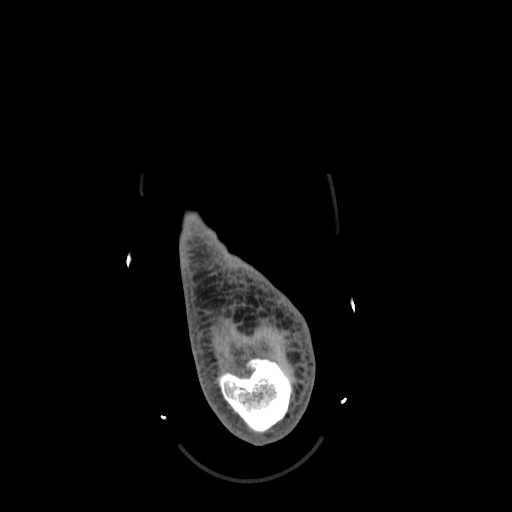
[im 11/64  bone]
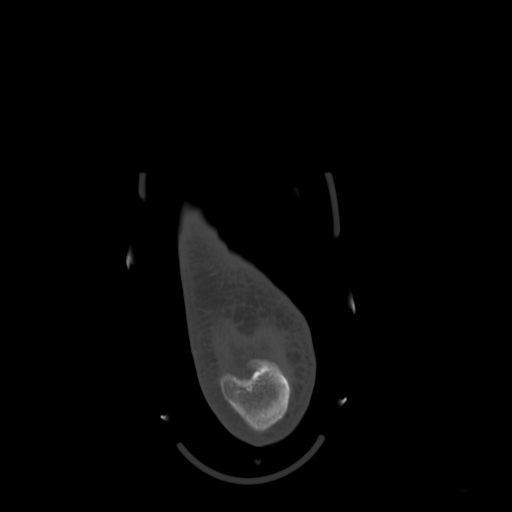
[im 22/64  bone]
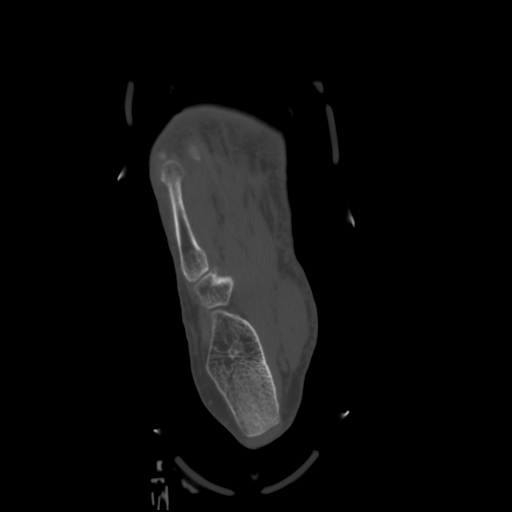
[im 32/64  bone]
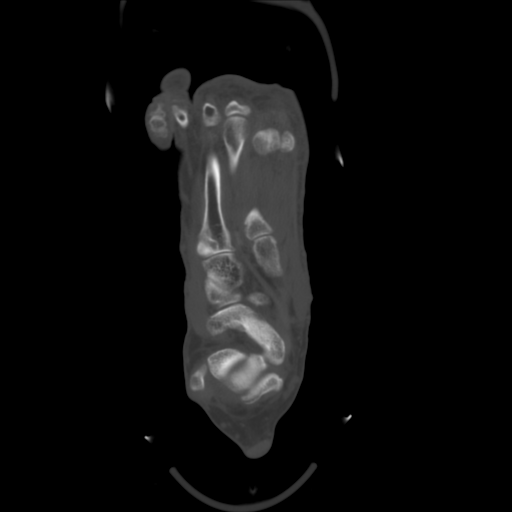
[im 43/64  bone]
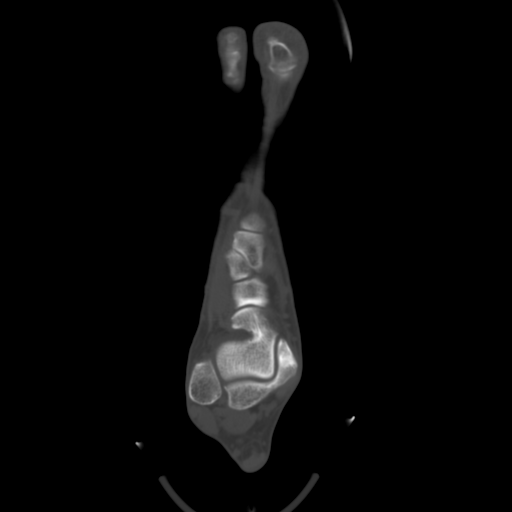
[im 53/64  soft-tissue]
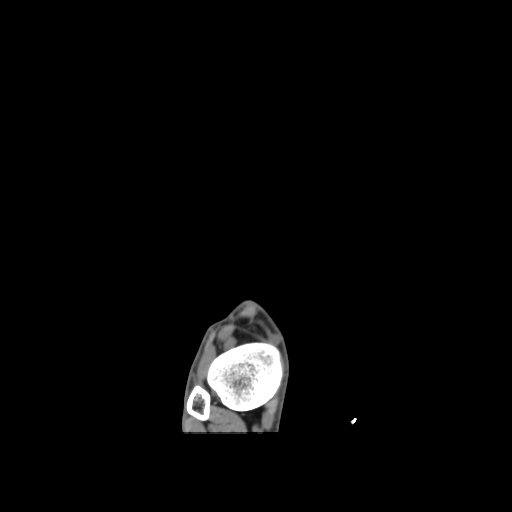
[im 53/64  bone]
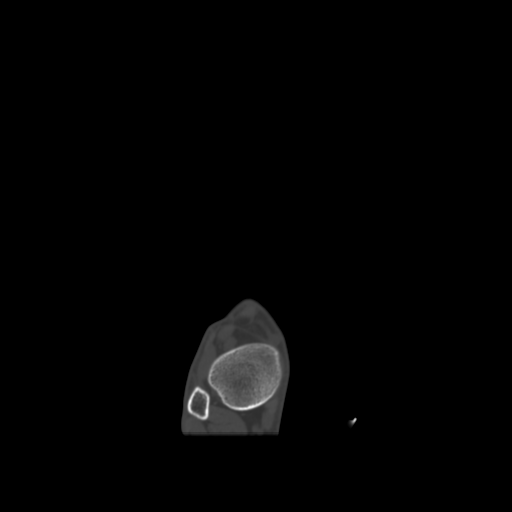

[10 of 14 positions shown; findings below may reference images not displayed]

FINDINGS: Bones/Joint/Cartilage

No acute bony or joint abnormality is identified. No CT evidence of
stress change is seen. Joint spaces are preserved. Alignment and
mineralization are normal. No erosion, osteophytosis or periostitis.
Prominent plantar calcaneal spur is noted.

Ligaments

Suboptimally assessed by CT.  Appear intact.

Muscles and Tendons

Appear normal.

Soft tissues

The proximal plantar fascia appears thickened, particularly the
medial cord. No rupture is identified. No fluid collection or mass
is seen.
IMPRESSION: Thickening of the proximal plantar fascia is more notable in the
medial cord and compatible with plantar fasciitis. No rupture. The
exam is otherwise negative.

## 2018-08-19 ENCOUNTER — Encounter: Payer: BLUE CROSS/BLUE SHIELD | Admitting: Neurology

## 2018-09-08 ENCOUNTER — Ambulatory Visit: Payer: BLUE CROSS/BLUE SHIELD | Admitting: Neurology

## 2018-11-11 ENCOUNTER — Encounter: Payer: BLUE CROSS/BLUE SHIELD | Admitting: Neurology

## 2018-11-11 ENCOUNTER — Telehealth: Payer: Self-pay | Admitting: Neurology

## 2018-11-11 NOTE — Telephone Encounter (Signed)
This patient did not show for an EMG and nerve conduction study evaluation today. 

## 2019-05-31 ENCOUNTER — Other Ambulatory Visit: Payer: Self-pay | Admitting: Family Medicine

## 2019-05-31 DIAGNOSIS — M8580 Other specified disorders of bone density and structure, unspecified site: Secondary | ICD-10-CM

## 2020-12-21 ENCOUNTER — Other Ambulatory Visit: Payer: Self-pay

## 2020-12-21 ENCOUNTER — Emergency Department (HOSPITAL_COMMUNITY)
Admission: EM | Admit: 2020-12-21 | Discharge: 2020-12-21 | Disposition: A | Discharge status: Home / Self Care | Payer: BC Managed Care – PPO | Attending: Emergency Medicine | Admitting: Emergency Medicine

## 2020-12-21 ENCOUNTER — Emergency Department (HOSPITAL_COMMUNITY): Payer: BC Managed Care – PPO

## 2020-12-21 ENCOUNTER — Encounter (HOSPITAL_COMMUNITY): Payer: Self-pay | Admitting: *Deleted

## 2020-12-21 DIAGNOSIS — R072 Precordial pain: Secondary | ICD-10-CM | POA: Insufficient documentation

## 2020-12-21 DIAGNOSIS — Z87891 Personal history of nicotine dependence: Secondary | ICD-10-CM | POA: Diagnosis not present

## 2020-12-21 DIAGNOSIS — I10 Essential (primary) hypertension: Secondary | ICD-10-CM | POA: Insufficient documentation

## 2020-12-21 DIAGNOSIS — R079 Chest pain, unspecified: Secondary | ICD-10-CM | POA: Diagnosis present

## 2020-12-21 LAB — CBC WITH DIFFERENTIAL/PLATELET
Abs Immature Granulocytes: 0.02 10*3/uL (ref 0.00–0.07)
Basophils Absolute: 0 10*3/uL (ref 0.0–0.1)
Basophils Relative: 1 %
Eosinophils Absolute: 0.2 10*3/uL (ref 0.0–0.5)
Eosinophils Relative: 3 %
HCT: 40 % (ref 36.0–46.0)
Hemoglobin: 13.7 g/dL (ref 12.0–15.0)
Immature Granulocytes: 0 %
Lymphocytes Relative: 25 %
Lymphs Abs: 1.3 10*3/uL (ref 0.7–4.0)
MCH: 32.2 pg (ref 26.0–34.0)
MCHC: 34.3 g/dL (ref 30.0–36.0)
MCV: 93.9 fL (ref 80.0–100.0)
Monocytes Absolute: 0.5 10*3/uL (ref 0.1–1.0)
Monocytes Relative: 9 %
Neutro Abs: 3.3 10*3/uL (ref 1.7–7.7)
Neutrophils Relative %: 62 %
Platelets: 280 10*3/uL (ref 150–400)
RBC: 4.26 MIL/uL (ref 3.87–5.11)
RDW: 11.9 % (ref 11.5–15.5)
WBC: 5.3 10*3/uL (ref 4.0–10.5)
nRBC: 0 % (ref 0.0–0.2)

## 2020-12-21 LAB — BASIC METABOLIC PANEL
Anion gap: 10 (ref 5–15)
BUN: 19 mg/dL (ref 6–20)
CO2: 26 mmol/L (ref 22–32)
Calcium: 9.8 mg/dL (ref 8.9–10.3)
Chloride: 104 mmol/L (ref 98–111)
Creatinine, Ser: 0.8 mg/dL (ref 0.44–1.00)
GFR, Estimated: >60 mL/min (ref >60)
Glucose, Bld: 86 mg/dL (ref 70–99)
Potassium: 3.5 mmol/L (ref 3.5–5.1)
Sodium: 140 mmol/L (ref 135–145)

## 2020-12-21 LAB — TROPONIN I (HIGH SENSITIVITY)
Troponin I (High Sensitivity): 13 ng/L (ref <18)
Troponin I (High Sensitivity): 12 ng/L (ref <18)
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
## 2020-12-21 NOTE — ED Provider Notes (Signed)
Emergency Medicine Provider Triage Evaluation Note  Andrea Odom , a 60 y.o. female  was evaluated in triage.  Pt complains of chest pain, left side of her chest, does not radiate, not associate with shortness of breath, becoming diaphoretic, nausea or vomiting.  She states that she has had chest pain for about 1 years time, over last few days pain has become more consistent, states the pain comes and goes, came on today at 8 AM, while she sitting at her desk, chest pain is since resolved.  No cardiac history, no history of PEs or DVTs..  Review of Systems  Positive: Chest pain Negative: Short of breath, leg swelling  Physical Exam  BP (!) 160/99 (BP Location: Right Arm)   Pulse 69   Temp 98.7 F (37.1 C) (Oral)   Resp 16   SpO2 97%  Gen:   Awake, no distress   Resp:  Normal effort  MSK:   Moves extremities without difficulty  Other:    Medical Decision Making  Medically screening exam initiated at 1:57 PM.  Appropriate orders placed.  [name] was informed that the remainder of the evaluation will be completed by another provider, this initial triage assessment does not replace that evaluation, and the importance of remaining in the ED until their evaluation is complete.  Chest pain, patient need further work-up.   Carroll Sage, PA-C 12/21/20 1358    Alvira Monday, MD 12/21/20 [phone]

## 2020-12-21 NOTE — Discharge Instructions (Signed)
You came to the emergency department today to be evaluated for your chest pain.  Your EKG, chest x-ray, and lab work were reassuring that you are not having acute heart attack today.  Due to your chest pain is important that you follow-up with your primary care provider.  Please call to schedule appointment.  Get help right away if: Your chest pain gets worse. You have a cough that gets worse, or you cough up blood. You have severe pain in your abdomen. You faint. You have sudden, unexplained chest discomfort. You have sudden, unexplained discomfort in your arms, back, neck, or jaw. You have shortness of breath at any time. You suddenly start to sweat, or your skin gets clammy. You feel nausea or you vomit. You suddenly feel lightheaded or dizzy. You have severe weakness, or unexplained weakness or fatigue. Your heart begins to beat quickly, or it feels like it is skipping beats.

## 2020-12-21 NOTE — ED Provider Notes (Signed)
[facility] EMERGENCY DEPARTMENT Provider Note   CSN: [id number] Arrival date & time: 12/21/20  1345     History Chief Complaint  Patient presents with   Chest Pain    Andrea Odom is a 60 y.o. female with a history of hypertension, and obesity.  Presents to the emergency department with a chief complaint of chest pain.  Patient reports that pain started at approximately 1100 this afternoon while at rest at work.  Pain is located to the left side of her chest.  Pain does not radiate.  Pain is described as sharp.  Pain was intermittent over 15 minutes.  No associated shortness of breath, nausea, vomiting, or diaphoresis.  Patient reports that she has had similar episodes of chest pain to this over the last year.  Chest pain has become more frequent in the last 3 weeks.  Patient reports that she experiences this 1-2 times daily.  Patient reports that she has been under more stress of the last 3 weeks.  Patient denies any smoking, diabetes, CVA, surgery, hemoptysis, cancer treatment, unilateral leg swelling, history of DVT or PE, hormone therapy.  Patient denies any fevers, chills, cough, syncope, lightheadedness, dizziness, palpitations, leg swelling.     Chest Pain Associated symptoms: no abdominal pain, no back pain, no cough, no dizziness, no fever, no headache, no nausea, no palpitations, no shortness of breath and no vomiting    HPI: A 60 year old patient with a history of hypertension, hypercholesterolemia and obesity presents for evaluation of chest pain. Initial onset of pain was more than 6 hours ago. The patient's chest pain is well-localized, is sharp and is not worse with exertion. The patient's chest pain is middle- or left-sided, is not described as heaviness/pressure/tightness and does not radiate to the arms/jaw/neck. The patient does not complain of nausea and denies diaphoresis. The patient has a family history of coronary artery disease in a first-degree  relative with onset less than age 20. The patient has no history of stroke, has no history of peripheral artery disease, has not smoked in the past 90 days and denies any history of treated diabetes.   Past Medical History:  Diagnosis Date   Abdominal pain    Acid reflux    Bilateral kidney stones    Hematuria    Hypertension    Kidney stone on left side    Left flank pain    Microscopic hematuria    Over weight    Sleep apnea    Vaginal discharge    Vaginal pain     There are no problems to display for this patient.   Past Surgical History:  Procedure Laterality Date   CESAREAN SECTION     LAPAROSCOPIC HYSTERECTOMY       OB History   No obstetric history on file.     Family History  Problem Relation Age of Onset   Heart disease Father    Hematuria Father    Bladder Cancer Neg Hx    Prostate cancer Neg Hx    Kidney cancer Neg Hx     Social History   Tobacco Use   Smoking status: Former   Smokeless tobacco: Never  Substance Use Topics   Alcohol use: Yes    Alcohol/week: 0.0 standard drinks   Drug use: No    Home Medications Prior to Admission medications   Medication Sig Start Date End Date Taking? Authorizing Provider  omeprazole (PRILOSEC) 20 MG capsule Take 20 mg by mouth.  [provider]    Allergies    Patient has no known allergies.  Review of Systems   Review of Systems  Constitutional:  Negative for chills and fever.  Eyes:  Negative for visual disturbance.  Respiratory:  Negative for cough and shortness of breath.   Cardiovascular:  Positive for chest pain. Negative for palpitations and leg swelling.  Gastrointestinal:  Negative for abdominal pain, nausea and vomiting.  Genitourinary:  Negative for difficulty urinating and dysuria.  Musculoskeletal:  Negative for back pain and neck pain.  Skin:  Negative for color change and rash.  Neurological:  Negative for dizziness, syncope, light-headedness and headaches.   Psychiatric/Behavioral:  Negative for confusion.    Physical Exam Updated Vital Signs BP (!) 165/94   Pulse 63   Temp 98.7 F (37.1 C) (Oral)   Resp (!) 24   SpO2 99%   Physical Exam Vitals and nursing note reviewed.  Constitutional:      General: She is not in acute distress.    Appearance: She is not ill-appearing, toxic-appearing or diaphoretic.  HENT:     Head: Normocephalic.  Eyes:     General: No scleral icterus.       Right eye: No discharge.        Left eye: No discharge.  Cardiovascular:     Rate and Rhythm: Normal rate.     Pulses:          Radial pulses are 2+ on the right side and 2+ on the left side.     Heart sounds: Normal heart sounds.  Pulmonary:     Effort: Pulmonary effort is normal. No tachypnea, bradypnea or respiratory distress.     Breath sounds: Normal breath sounds. No stridor.  Abdominal:     General: There is no distension. There are no signs of injury.     Palpations: Abdomen is soft. There is no mass or pulsatile mass.     Tenderness: There is no abdominal tenderness. There is no guarding or rebound.     Hernia: There is no hernia in the umbilical area or ventral area.  Musculoskeletal:     Cervical back: Normal range of motion and neck supple.     Right lower leg: Normal. No swelling or tenderness. No edema.     Left lower leg: Normal. No swelling or tenderness. No edema.  Skin:    General: Skin is warm and dry.     Coloration: Skin is not cyanotic or pale.  Neurological:     General: No focal deficit present.     Mental Status: She is alert and oriented to person, place, and time.     GCS: GCS eye subscore is 4. GCS verbal subscore is 5. GCS motor subscore is 6.  Psychiatric:        Behavior: Behavior is cooperative.    ED Results / Procedures / Treatments   Labs (all labs ordered are listed, but only abnormal results are displayed) Labs Reviewed  BASIC METABOLIC PANEL  CBC WITH DIFFERENTIAL/PLATELET  TROPONIN I (HIGH  SENSITIVITY)  TROPONIN I (HIGH SENSITIVITY)    EKG None  Radiology DG Chest 2 View  Result Date: 12/21/2020 CLINICAL DATA:  Chest pain EXAM: CHEST - 2 VIEW COMPARISON:  None. FINDINGS: Top-normal heart size. Normal mediastinal contour. No pneumothorax. No pleural effusion. Lungs appear clear, with no acute consolidative airspace disease and no pulmonary edema. IMPRESSION: No active cardiopulmonary disease. Electronically Signed   By: Jannifer Rodney.D.  On: 12/21/2020 14:47    Procedures Procedures   Medications Ordered in ED Medications - No data to display  ED Course  I have reviewed the triage vital signs and the nursing notes.  Pertinent labs & imaging results that were available during my care of the patient were reviewed by me and considered in my medical decision making (see chart for details).    MDM Rules/Calculators/A&P HEAR Score: 3                         Alert 60 year old female no acute distress, nontoxic appearing.  Presents to the emergency department with a chief complaint of chest pain.  Pain occurred at 1100, lasted for 15 minutes.  Was intermittent over that time and described as sharp.  No associated nausea, vomiting, shortness of breath, or diaphoresis.  Patient is chest pain-free at this time.  ACS work-up was initiated while patient was in triage. Chest x-ray shows no active cardiopulmonary disease EKG shows normal sinus rhythm Troponin 13 and 12 with delta of -1 Heart score of 3 Suspicion for ACS at this time  Low suspicion for PE as patient is low risk based on Wells criteria for PE.  Will have patient see primary care provider for close follow-up.  Discussed results, findings, treatment and follow up. Patient advised of return precautions. Patient verbalized understanding and agreed with plan.   Final Clinical Impression(s) / ED Diagnoses Final diagnoses:  Precordial chest pain    Rx / DC Orders ED Discharge Orders     None         Haskel Schroeder, PA-C 12/21/20 2121    Rozelle Logan, DO 12/22/20 2351

## 2021-01-11 ENCOUNTER — Ambulatory Visit: Payer: BC Managed Care – PPO | Admitting: Cardiology

## 2021-01-11 ENCOUNTER — Other Ambulatory Visit: Payer: Self-pay

## 2021-01-11 ENCOUNTER — Encounter: Payer: Self-pay | Admitting: Cardiology

## 2021-01-11 VITALS — BP 130/84 | HR 87 | Temp 97.0°F | Resp 17 | Ht 62.0 in | Wt 191.2 lb

## 2021-01-11 DIAGNOSIS — R0789 Other chest pain: Secondary | ICD-10-CM

## 2021-01-11 DIAGNOSIS — G4733 Obstructive sleep apnea (adult) (pediatric): Secondary | ICD-10-CM

## 2021-01-11 DIAGNOSIS — I10 Essential (primary) hypertension: Secondary | ICD-10-CM

## 2021-01-11 DIAGNOSIS — R0609 Other forms of dyspnea: Secondary | ICD-10-CM

## 2021-01-11 DIAGNOSIS — R55 Syncope and collapse: Secondary | ICD-10-CM

## 2021-01-11 DIAGNOSIS — Z8249 Family history of ischemic heart disease and other diseases of the circulatory system: Secondary | ICD-10-CM

## 2021-01-11 DIAGNOSIS — R0600 Dyspnea, unspecified: Secondary | ICD-10-CM
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
## 2021-01-26 ENCOUNTER — Inpatient Hospital Stay: Payer: BC Managed Care – PPO

## 2021-01-26 DIAGNOSIS — R55 Syncope and collapse: Secondary | ICD-10-CM

## 2021-02-13 ENCOUNTER — Other Ambulatory Visit: Payer: Self-pay

## 2021-02-13 ENCOUNTER — Ambulatory Visit: Payer: BC Managed Care – PPO

## 2021-02-13 ENCOUNTER — Inpatient Hospital Stay: Payer: BC Managed Care – PPO

## 2021-02-13 DIAGNOSIS — R55 Syncope and collapse: Secondary | ICD-10-CM

## 2021-03-01 ENCOUNTER — Ambulatory Visit: Payer: BC Managed Care – PPO | Admitting: Cardiology

## 2021-03-01 ENCOUNTER — Other Ambulatory Visit: Payer: Self-pay

## 2021-03-01 ENCOUNTER — Ambulatory Visit: Payer: BC Managed Care – PPO

## 2021-03-01 DIAGNOSIS — R55 Syncope and collapse: Secondary | ICD-10-CM

## 2021-03-01 LAB — PCV CARDIAC STRESS TEST
Angina Index: 0
ST Depression (mm): 0 mm

## 2021-03-08 ENCOUNTER — Encounter: Payer: Self-pay | Admitting: Student

## 2021-03-08 ENCOUNTER — Other Ambulatory Visit: Payer: Self-pay

## 2021-03-08 ENCOUNTER — Ambulatory Visit: Payer: BC Managed Care – PPO | Admitting: Student

## 2021-03-08 ENCOUNTER — Ambulatory Visit: Payer: BC Managed Care – PPO | Admitting: Cardiology

## 2021-03-08 VITALS — BP 122/81 | HR 89 | Temp 97.7°F | Resp 17 | Ht 62.0 in | Wt 195.0 lb

## 2021-03-08 DIAGNOSIS — R55 Syncope and collapse: Secondary | ICD-10-CM

## 2021-03-08 DIAGNOSIS — I10 Essential (primary) hypertension: Secondary | ICD-10-CM

## 2021-03-08 DIAGNOSIS — R0789 Other chest pain: Secondary | ICD-10-CM

## 2021-03-08 MED ORDER — METOPROLOL SUCCINATE ER 25 MG PO TB24: 12.5000 mg | ORAL_TABLET | Freq: Every day | ORAL | 3 refills | Status: DC

## 2021-03-08 NOTE — Progress Notes (Signed)
Primary Physician/Referring:  Marvia Pickles, PA-C  Patient ID: Andrea Odom, female    DOB: 04-Jan-1961, 60 y.o.   MRN: [id number]  Chief Complaint  Patient presents with   Follow-up    6 WEEKS   Loss of Consciousness   Hypertension   HPI:    Andrea Odom  is a 60 y.o. Caucasian female patient with hypertension, family history of premature coronary disease in her father at age 5 had MI, previously morbidly obese now mild to moderately obese, diagnosis of OSA about 15 to 20 years ago, presently not on any CPAP, states that with weight loss she has been sleeping well, GERD.  Originally referred for evaluation of chest pain and worsening dyspnea on exertion over the last 1 year as well as an episode of syncope in 11/2020.  Patient establish care with our office 01/11/2021 with Dr. Jacinto Halim.  At last office visit ordered 2-week cardiac monitor and referred for sleep evaluation.  Patient's monitor revealed episodes of atrial tachycardia which were asymptomatic as well as PACs and PVCs.  Patient's symptoms and monitor correlated with normal sinus rhythm and PACs.  Patient now presents for follow-up.  Upon further questioning patient admits that she did not intentionally push the monitor button while wearing it.  Therefore there is concern that patient may have had symptoms associated with atrial tachycardia as well.  Patient has had no recurrence of syncope or near syncope since last office visit.  Last office visit she was referred for sleep evaluation, however patient is refusing this as she feels sleep apnea has resolved with weight loss.  Patient has lost 40 pounds over the last 2 years.  At today's office visit patient is without specific complaints.  Denies chest pain, dyspnea, dizziness, palpitations.  Denies orthopnea, PND, leg swelling.  Past Medical History:  Diagnosis Date   Abdominal pain    Acid reflux    Bilateral kidney stones    Hematuria    Hypertension    Kidney stone on  left side    Left flank pain    Microscopic hematuria    Over weight    Sleep apnea    Vaginal discharge    Vaginal pain    Past Surgical History:  Procedure Laterality Date   CESAREAN SECTION     LAPAROSCOPIC HYSTERECTOMY     Family History  Problem Relation Age of Onset   Heart disease Father 5       MI   Hematuria Father    Bladder Cancer Neg Hx    Prostate cancer Neg Hx    Kidney cancer Neg Hx     Social History   Tobacco Use   Smoking status: Former    Packs/day: 0.25    Years: 3.00    Pack years: 0.75    Types: Cigarettes    Quit date: 1980    Years since quitting: 42.8   Smokeless tobacco: Never  Substance Use Topics   Alcohol use: Yes    Comment: occasionally   Marital Status: Married  ROS  Review of Systems  Constitutional: Negative for malaise/fatigue and weight gain.  Cardiovascular:  Positive for chest pain (resovled), dyspnea on exertion (improved) and syncope (no recurruence). Negative for claudication, leg swelling, near-syncope, orthopnea, palpitations and paroxysmal nocturnal dyspnea.  Respiratory:  Negative for shortness of breath.   Gastrointestinal:  Negative for melena.  Neurological:  Negative for dizziness, headaches and seizures.   Objective  Blood pressure 122/81, pulse 89, temperature  97.7 F (36.5 C), temperature source Temporal, resp. rate 17, height 5\' 2"  (1.575 m), weight 195 lb (88.5 kg), SpO2 97 %. Body mass index is 35.67 kg/m.  Vitals with BMI 03/08/2021 03/08/2021 01/11/2021  Height - 5\' 2"  5\' 2"   Weight - 195 lbs 191 lbs 3 oz  BMI - 35.66 34.96  Systolic 122 157 01/13/2021  Diastolic 81 89 84  Pulse 89 90 87     Physical Exam Vitals reviewed.  Constitutional:      Appearance: She is obese.  Neck:     Vascular: No carotid bruit or JVD.  Cardiovascular:     Rate and Rhythm: Normal rate and regular rhythm.     Pulses: Intact distal pulses.     Heart sounds: Normal heart sounds. No murmur heard.   No gallop.  Pulmonary:      Effort: Pulmonary effort is normal.     Breath sounds: Normal breath sounds.  Abdominal:     General: Bowel sounds are normal.     Palpations: Abdomen is soft.  Musculoskeletal:        General: No swelling.  Physical exam unchanged compared to last office visit.  Laboratory examination:   Recent Labs    12/21/20 1403  NA 140  K 3.5  CL 104  CO2 26  GLUCOSE 86  BUN 19  CREATININE 0.80  CALCIUM 9.8  GFRNONAA >60   CrCl cannot be calculated (Patient's most recent lab result is older than the maximum 21 days allowed.).  CMP Latest Ref Rng & Units 12/21/2020  Glucose 70 - 99 mg/dL 86  BUN 6 - 20 mg/dL 19  Creatinine 482 - 12/23/20 mg/dL 12/23/2020  Sodium 5.00 - 3.70 mmol/L 140  Potassium 3.5 - 5.1 mmol/L 3.5  Chloride 98 - 111 mmol/L 104  CO2 22 - 32 mmol/L 26  Calcium 8.9 - 10.3 mg/dL 9.8   CBC Latest Ref Rng & Units 12/21/2020 04/12/2014  WBC 4.0 - 10.5 K/uL 5.3 4.8  Hemoglobin 12.0 - 15.0 g/dL 694 12/23/2020  Hematocrit 04/14/2014 - 46.0 % 40.0 39.7  Platelets 150 - 400 K/uL 280 245   Lipid Panel No results for input(s): CHOL, TRIG, LDLCALC, VLDL, HDL, CHOLHDL, LDLDIRECT in the last 8760 hours. Lipid Panel  No results found for: CHOL, TRIG, HDL, CHOLHDL, VLDL, LDLCALC, LDLDIRECT, LABVLDL   HEMOGLOBIN A1C No results found for: HGBA1C, MPG TSH No results for input(s): TSH in the last 8760 hours.  External labs:   NA Medications and allergies  No Known Allergies   Medication prior to this encounter:   Outpatient Medications Prior to Visit  Medication Sig Dispense Refill   amLODipine (NORVASC) 5 MG tablet Take 5 mg by mouth at bedtime.     Calcium Carb-Cholecalciferol (CALCIUM 600 + D PO) Take 1 tablet by mouth daily.     Cholecalciferol (VITAMIN D3) 125 MCG (5000 UT) CAPS Take 1 capsule by mouth daily.     DULoxetine (CYMBALTA) 30 MG capsule Take 30 mg by mouth daily.     losartan-hydrochlorothiazide (HYZAAR) 100-12.5 MG tablet Take 1 tablet by mouth daily.     meclizine  (ANTIVERT) 25 MG tablet Take 25 mg by mouth 3 (three) times daily as needed for dizziness.     meloxicam (MOBIC) 15 MG tablet Take 15 mg by mouth daily as needed.     Misc Natural Products (TOTAL MEMORY & FOCUS FORMULA) TABS Take 1 tablet by mouth daily.     NON FORMULARY Take 1  capsule by mouth daily. Neuropathy Support with vitamin B complex, folic acid, and Vitamin D     NON FORMULARY Take 1 capsule by mouth daily. Instaflex advanced     omeprazole (PRILOSEC) 20 MG capsule Take 20 mg by mouth.     Probiotic Product (RA PROBIOTIC COMPLEX PO) Take 1 capsule by mouth daily.     No facility-administered medications prior to visit.     Medication list after today's encounter   Current Outpatient Medications  Medication Instructions   amLODipine (NORVASC) 5 mg, Oral, Daily at bedtime   Calcium Carb-Cholecalciferol (CALCIUM 600 + D PO) 1 tablet, Oral, Daily   Cholecalciferol (VITAMIN D3) 125 MCG (5000 UT) CAPS 1 capsule, Oral, Daily   DULoxetine (CYMBALTA) 30 mg, Oral, Daily   losartan-hydrochlorothiazide (HYZAAR) 100-12.5 MG tablet 1 tablet, Oral, Daily   meclizine (ANTIVERT) 25 mg, Oral, 3 times daily PRN   meloxicam (MOBIC) 15 mg, Oral, Daily PRN   metoprolol succinate (TOPROL-XL) 12.5 mg, Oral, Daily, Take with or immediately following a meal.   Misc Natural Products (TOTAL MEMORY & FOCUS FORMULA) TABS 1 tablet, Oral, Daily   NON FORMULARY 1 capsule, Oral, Daily, Neuropathy Support with vitamin B complex, folic acid, and Vitamin D   NON FORMULARY 1 capsule, Oral, Daily, Instaflex advanced   omeprazole (PRILOSEC) 20 mg, Oral   Probiotic Product (RA PROBIOTIC COMPLEX PO) 1 capsule, Oral, Daily    Radiology:   Chest x-ray PA and lateral view 12/21/2020: Top-normal heart size. Normal mediastinal contour. No pneumothorax. No pleural effusion. Lungs appear clear, with no acute consolidative airspace disease and no pulmonary edema.  Cardiac Studies:   PCV ECHOCARDIOGRAM COMPLETE  02/13/2021 Normal LV systolic function with visual EF 55-60%. Left ventricle cavity is minimally dilated. Moderate left ventricular hypertrophy. Normal global wall motion. Normal diastolic filling pattern, normal LAP. Left atrial cavity is moderately dilated. Mild (Grade I) aortic regurgitation. Mild (Grade I) mitral regurgitation. Mild tricuspid regurgitation. No evidence of pulmonary hypertension. RVSP measures 34 mmHg. No prior study for comparison.   PCV CARDIAC STRESS TEST 03/01/2021 Exercise treadmill stress test performed using Bruce protocol.  Patient reached 10.2 METS, and 109% of age predicted maximum heart rate.  Exercise capacity was excellent.  No chest pain reported.  Normal heart rate and hemodynamic response. Peak stress EKG difficult to interpret due to artifact. Stress EKG at 1 min into recovery showed sinus tachycardia, no significant ST-T changes. Low risk study.  Zio Patch Extended out patient EKG monitoring 14 days starting 02/13/2021: Predominant rhythm is normal sinus rhythm.  There were 19 brief atrial tachycardia episodes, longest 11.7 seconds at rate of 126 bpm.  Isolated PACs and PVCs and atrial triplets were present. There was no ventricular tachycardia or significant ventricular arrhythmia or heart block. There are 5 triggered events which correlated with sinus rhythm and supraventricular ectopics.  There were 0 diary entries.  EKG:   EKG 01/11/2021: Normal sinus rhythm at rate of 76 beats minute, normal axis.  Incomplete right bundle branch block.  Normal EKG.  No significant change from 12/20/2020.  Assessment     ICD-10-CM   1. Syncope and collapse  R55     2. Atypical chest pain  R07.89     3. Primary hypertension  I10        There are no discontinued medications.  Meds ordered this encounter  Medications   metoprolol succinate (TOPROL-XL) 25 MG 24 hr tablet    Sig: Take 0.5 tablets (12.5 mg total)  by mouth daily. Take with or immediately following  a meal.    Dispense:  45 tablet    Refill:  3    No orders of the defined types were placed in this encounter.  Recommendations:   SALEHA KALP is a 60 y.o. Caucasian female patient with hypertension, family history of premature coronary disease in her father at age 18 had MI, previously morbidly obese now mild to moderately obese, diagnosis of OSA about 15 to 20 years ago, presently not on any CPAP, states that with weight loss she has been sleeping well, GERD.  Originally referred for evaluation of chest pain and worsening dyspnea on exertion over the last 1 year as well as an episode of syncope in 11/2020.  Patient establish care with our office 01/11/2021 with Dr. Jacinto Halim.  At last office visit ordered 2-week cardiac monitor and referred for sleep evaluation.  Patient's monitor revealed episodes of atrial tachycardia which were asymptomatic as well as PACs and PVCs.  Patient's symptoms and monitor correlated with normal sinus rhythm and PACs.  Also last office visit ordered treadmill stress test was overall low risk and echocardiogram which noted normal LVEF, moderate LVH, moderate LA dilation and mild valvular disease.  Patient now presents for follow-up.  Patient has had no recurrence of syncope or near syncope.  Notably she is refusing sleep evaluation to reevaluate sleep apnea.  Reviewed and discussed at length with patient results of cardiac testing including echocardiogram, cardiac monitor, and stress test.  Patient has had no recurrence of syncope or near syncope, however given her lack of postictal symptoms or prodromal symptoms syncope is concerning for cardiac arrhythmia.  Patient did have atrial tachycardia noted and cardiac monitor, it is unclear whether she was symptomatic or not as patient did not know when she was supposed to breast the bottom indicating symptoms.  Given atrial tachycardia and potentially symptomatic PACs and PVCs we will start low-dose beta-blocker therapy with  metoprololSuccinate 12.5 mg once daily.  Given patient's history of syncope discussed with her management options including loop recorder implantation.  Discussed at length with patient regarding alternatives, risks, benefits of loop recorder implantation.  Patient verbalized understanding and wishes to proceed with the procedure.  We will set patient up for loop recorder implantation and follow-up.  We will also schedule III month office visit for follow-up.   Rayford Halsted, PA-C 03/08/2021, 4:25 PM Office: [phone]

## 2021-03-27 ENCOUNTER — Ambulatory Visit: Payer: BC Managed Care – PPO | Admitting: Cardiology

## 2021-06-11 ENCOUNTER — Encounter: Payer: Self-pay | Admitting: Student

## 2021-06-11 ENCOUNTER — Other Ambulatory Visit: Payer: Self-pay

## 2021-06-11 ENCOUNTER — Ambulatory Visit: Payer: BC Managed Care – PPO | Admitting: Student

## 2021-06-11 VITALS — BP 144/87 | HR 85 | Temp 98.2°F | Resp 14 | Ht 62.0 in | Wt 196.2 lb

## 2021-06-11 DIAGNOSIS — I10 Essential (primary) hypertension: Secondary | ICD-10-CM

## 2021-06-11 DIAGNOSIS — R55 Syncope and collapse: Secondary | ICD-10-CM

## 2021-06-11 MED ORDER — METOPROLOL SUCCINATE ER 25 MG PO TB24: 25.0000 mg | ORAL_TABLET | Freq: Every day | ORAL | 3 refills | Status: AC

## 2021-06-11 NOTE — Progress Notes (Signed)
Primary Physician/Referring:  Minna Antis, PA-C  Patient ID: Andrea Odom, female    DOB: 1960-11-05, 61 y.o.   MRN: [id number]  Chief Complaint  Patient presents with   Follow-up    3 MONTH   AT PVC/PAC   HPI:    Andrea Odom  is a 61 y.o. Caucasian female patient with hypertension, family history of premature coronary disease in her father at age 49 had MI, previously morbidly obese now mild to moderately obese, diagnosis of OSA about 15 to 20 years ago, presently not on any CPAP, states that with weight loss she has been sleeping well, GERD.  Originally referred for evaluation of chest pain and worsening dyspnea on exertion over the last 1 year as well as an episode of syncope in 11/2020.  Patient establish care with our office 01/11/2021 with Dr. Einar Gip. She underwent 2-week cardiac monitor which revealed episodes of atrial tachycardia which were asymptomatic as well as PACs and PVCs.  Patient's symptoms and monitor correlated with normal sinus rhythm and PACs.  Last office visit started low-dose beta-blocker therapy with metoprolol succinate 12.5 mg once daily  Patient presents for 61-month follow-up of PACs/PVCs.  Also last office visit given history of syncope discussed prefer implantation, however this has not been done as patient decided to opt out.  She has had no recurrence of syncope or near syncope.  Palpitations have significantly improved with initiation of beta-blocker therapy.  She states she continues to have rare episodes lasting several seconds, but they are less frequent.  She does admit to dietary indiscretion including sodium intake.  Blood pressure is elevated at today's office visit as well as on home monitoring.  Denies chest pain, dyspnea, dizziness.  Denies orthopnea, PND, leg swelling.  Past Medical History:  Diagnosis Date   Abdominal pain    Acid reflux    Bilateral kidney stones    Hematuria    Hypertension    Kidney stone on left side    Left flank  pain    Microscopic hematuria    Over weight    Sleep apnea    Vaginal discharge    Vaginal pain    Past Surgical History:  Procedure Laterality Date   CESAREAN SECTION     LAPAROSCOPIC HYSTERECTOMY     Family History  Problem Relation Age of Onset   Heart disease Father 30       MI   Hematuria Father    Bladder Cancer Neg Hx    Prostate cancer Neg Hx    Kidney cancer Neg Hx     Social History   Tobacco Use   Smoking status: Former    Packs/day: 0.25    Years: 3.00    Pack years: 0.75    Types: Cigarettes    Quit date: 1980    Years since quitting: 43.0   Smokeless tobacco: Never  Substance Use Topics   Alcohol use: Yes    Comment: occasionally   Marital Status: Married  ROS  Review of Systems  Cardiovascular:  Negative for chest pain, claudication, leg swelling, near-syncope, orthopnea, palpitations, paroxysmal nocturnal dyspnea and syncope.  Respiratory:  Negative for shortness of breath.   Neurological:  Negative for dizziness.   Objective  Blood pressure (!) 144/87, pulse 85, temperature 98.2 F (36.8 C), temperature source Temporal, resp. rate 14, height 5\' 2"  (1.575 m), weight 196 lb 3.2 oz (89 kg), SpO2 95 %. Body mass index is 35.89 kg/m.  Vitals with  BMI 06/11/2021 06/11/2021 03/08/2021  Height - 5\' 2"  -  Weight - 196 lbs 3 oz -  BMI - 99991111 -  Systolic [id number] [id number]  Diastolic 87 75 81  Pulse 85 83 89     Physical Exam Vitals reviewed.  Constitutional:      Appearance: She is obese.  Neck:     Vascular: No carotid bruit or JVD.  Cardiovascular:     Rate and Rhythm: Normal rate and regular rhythm.     Pulses: Intact distal pulses.     Heart sounds: Normal heart sounds. No murmur heard.   No gallop.  Pulmonary:     Effort: Pulmonary effort is normal.     Breath sounds: Normal breath sounds.  Abdominal:     General: Bowel sounds are normal.     Palpations: Abdomen is soft.  Musculoskeletal:        General: No swelling.  Physical exam  unchanged compared to last office visit.  Laboratory examination:   Recent Labs    12/21/20 1403  NA 140  K 3.5  CL 104  CO2 26  GLUCOSE 86  BUN 19  CREATININE 0.80  CALCIUM 9.8  GFRNONAA >60   CrCl cannot be calculated (Patient's most recent lab result is older than the maximum 21 days allowed.).  CMP Latest Ref Rng & Units 12/21/2020  Glucose 70 - 99 mg/dL 86  BUN 6 - 20 mg/dL 19  Creatinine 0.44 - 1.00 mg/dL 0.80  Sodium 135 - 145 mmol/L 140  Potassium 3.5 - 5.1 mmol/L 3.5  Chloride 98 - 111 mmol/L 104  CO2 22 - 32 mmol/L 26  Calcium 8.9 - 10.3 mg/dL 9.8   CBC Latest Ref Rng & Units 12/21/2020 04/12/2014  WBC 4.0 - 10.5 K/uL 5.3 4.8  Hemoglobin 12.0 - 15.0 g/dL 13.7 13.6  Hematocrit 36.0 - 46.0 % 40.0 39.7  Platelets 150 - 400 K/uL 280 245   Lipid Panel No results for input(s): CHOL, TRIG, LDLCALC, VLDL, HDL, CHOLHDL, LDLDIRECT in the last 8760 hours. Lipid Panel  No results found for: CHOL, TRIG, HDL, CHOLHDL, VLDL, LDLCALC, LDLDIRECT, LABVLDL   HEMOGLOBIN A1C No results found for: HGBA1C, MPG TSH No results for input(s): TSH in the last 8760 hours.  External labs:   NA Medications and allergies  No Known Allergies   Medication prior to this encounter:   Outpatient Medications Prior to Visit  Medication Sig Dispense Refill   amLODipine (NORVASC) 5 MG tablet Take 5 mg by mouth at bedtime.     Calcium Carb-Cholecalciferol (CALCIUM 600 + D PO) Take 1 tablet by mouth daily.     Cholecalciferol (VITAMIN D3) 125 MCG (5000 UT) CAPS Take 1 capsule by mouth daily.     DULoxetine (CYMBALTA) 30 MG capsule Take 30 mg by mouth daily.     losartan-hydrochlorothiazide (HYZAAR) 100-12.5 MG tablet Take 1 tablet by mouth daily.     meclizine (ANTIVERT) 25 MG tablet Take 25 mg by mouth 3 (three) times daily as needed for dizziness.     meloxicam (MOBIC) 15 MG tablet Take 15 mg by mouth daily as needed.     Misc Natural Products (TOTAL MEMORY & FOCUS FORMULA) TABS Take 1  tablet by mouth daily.     NON FORMULARY Take 1 capsule by mouth daily. Neuropathy Support with vitamin B complex, folic acid, and Vitamin D     NON FORMULARY Take 1 capsule by mouth daily. Instaflex advanced     omeprazole (  PRILOSEC) 20 MG capsule Take 20 mg by mouth.     Probiotic Product (RA PROBIOTIC COMPLEX PO) Take 1 capsule by mouth daily.     metoprolol succinate (TOPROL-XL) 25 MG 24 hr tablet Take 0.5 tablets (12.5 mg total) by mouth daily. Take with or immediately following a meal. 45 tablet 3   No facility-administered medications prior to visit.     Medication list after today's encounter   Current Outpatient Medications  Medication Instructions   amLODipine (NORVASC) 5 mg, Oral, Daily at bedtime   Calcium Carb-Cholecalciferol (CALCIUM 600 + D PO) 1 tablet, Oral, Daily   Cholecalciferol (VITAMIN D3) 125 MCG (5000 UT) CAPS 1 capsule, Oral, Daily   DULoxetine (CYMBALTA) 30 mg, Oral, Daily   losartan-hydrochlorothiazide (HYZAAR) 100-12.5 MG tablet 1 tablet, Oral, Daily   meclizine (ANTIVERT) 25 mg, Oral, 3 times daily PRN   meloxicam (MOBIC) 15 mg, Oral, Daily PRN   metoprolol succinate (TOPROL-XL) 25 mg, Oral, Daily, Take with or immediately following a meal.   Misc Natural Products (TOTAL MEMORY & FOCUS FORMULA) TABS 1 tablet, Oral, Daily   NON FORMULARY 1 capsule, Oral, Daily, Neuropathy Support with vitamin B complex, folic acid, and Vitamin D   NON FORMULARY 1 capsule, Oral, Daily, Instaflex advanced   omeprazole (PRILOSEC) 20 mg, Oral   Probiotic Product (RA PROBIOTIC COMPLEX PO) 1 capsule, Oral, Daily    Radiology:   Chest x-ray PA and lateral view 12/21/2020: Top-normal heart size. Normal mediastinal contour. No pneumothorax. No pleural effusion. Lungs appear clear, with no acute consolidative airspace disease and no pulmonary edema.  Cardiac Studies:   PCV ECHOCARDIOGRAM COMPLETE [id number] Normal LV systolic function with visual EF 55-60%. Left ventricle  cavity is minimally dilated. Moderate left ventricular hypertrophy. Normal global wall motion. Normal diastolic filling pattern, normal LAP. Left atrial cavity is moderately dilated. Mild (Grade I) aortic regurgitation. Mild (Grade I) mitral regurgitation. Mild tricuspid regurgitation. No evidence of pulmonary hypertension. RVSP measures 34 mmHg. No prior study for comparison.   PCV CARDIAC STRESS TEST 03/01/2021 Exercise treadmill stress test performed using Bruce protocol.  Patient reached 10.2 METS, and 109% of age predicted maximum heart rate.  Exercise capacity was excellent.  No chest pain reported.  Normal heart rate and hemodynamic response. Peak stress EKG difficult to interpret due to artifact. Stress EKG at 1 min into recovery showed sinus tachycardia, no significant ST-T changes. Low risk study.  Zio Patch Extended out patient EKG monitoring 14 days starting 02/13/2021: Predominant rhythm is normal sinus rhythm.  There were 19 brief atrial tachycardia episodes, longest 11.7 seconds at rate of 126 bpm.  Isolated PACs and PVCs and atrial triplets were present. There was no ventricular tachycardia or significant ventricular arrhythmia or heart block. There are 5 triggered events which correlated with sinus rhythm and supraventricular ectopics.  There were 0 diary entries.  EKG:  06/11/2021: Sinus rhythm at a rate of 76 bpm.  Normal axis.  No evidence of ischemia or underlying injury pattern.  Compared EKG 01/11/2021, no significant change.  Assessment     ICD-10-CM   1. Primary hypertension  I10 EKG 12-Lead    2. Syncope and collapse  R55 EKG 12-Lead       Medications Discontinued During This Encounter  Medication Reason   metoprolol succinate (TOPROL-XL) 25 MG 24 hr tablet Reorder    Meds ordered this encounter  Medications   metoprolol succinate (TOPROL-XL) 25 MG 24 hr tablet    Sig: Take 1 tablet (25  mg total) by mouth daily. Take with or immediately following a meal.     Dispense:  90 tablet    Refill:  3    Orders Placed This Encounter  Procedures   EKG 12-Lead    Recommendations:   Andrea Odom is a 61 y.o. Caucasian female patient with hypertension, family history of premature coronary disease in her father at age 77 had MI, previously morbidly obese now mild to moderately obese, diagnosis of OSA about 15 to 20 years ago, presently not on any CPAP, states that with weight loss she has been sleeping well, GERD.  Originally referred for evaluation of chest pain and worsening dyspnea on exertion over the last 1 year as well as an episode of syncope in 11/2020.  Patient establish care with our office 01/11/2021 with Dr. Einar Gip. She underwent 2-week cardiac monitor which revealed episodes of atrial tachycardia which were asymptomatic as well as PACs and PVCs.  Patient's symptoms and monitor correlated with normal sinus rhythm and PACs.  Last office visit started low-dose beta-blocker therapy with metoprolol succinate 12.5 mg once daily  Patient presents for 89-month follow-up of PACs/PVCs.  Also last office visit given history of syncope discussed loop recorder implantation, however this has not been done as patient opted out.  Patient has had no recurrence of syncope or near syncope.  Again discussed indication, risk, benefits of loop recorder implantation.  However patient is resistant to loop recorder implantation.  She will notify our office immediately of she has recurrence of syncope or near syncope, at that time we will reconsider loop recorder implantation, however for now we will continue to monitor closely.  Given that patient's blood pressure remains elevated above goal and she does continue to have episodes of palpitations although these have improved recommend up titration of beta-blocker therapy.  Will increase metoprolol from 12.5 mg to 25 mg daily.  Patient will continue to monitor blood pressure at home and notify our office if it remains >130/80  mmHg.  Notably patient has refused referral for sleep evaluation in the past to reevaluate sleep apnea.  Patient otherwise remains stable from a cardiovascular standpoint.  Follow-up in 6 months, sooner if needed   Alethia Berthold, PA-C 06/11/2021, 3:26 PM Office: [place], PA-C 06/11/2021, 3:26 PM Office: [phone]

## 2021-10-23 IMAGING — CR DG CHEST 2V
2 series · 2 of 2 positions shown · non-contrast
Comparison: None.

CLINICAL DATA: Chest pain

EXAM:
CHEST - 2 VIEW

[chest pa]
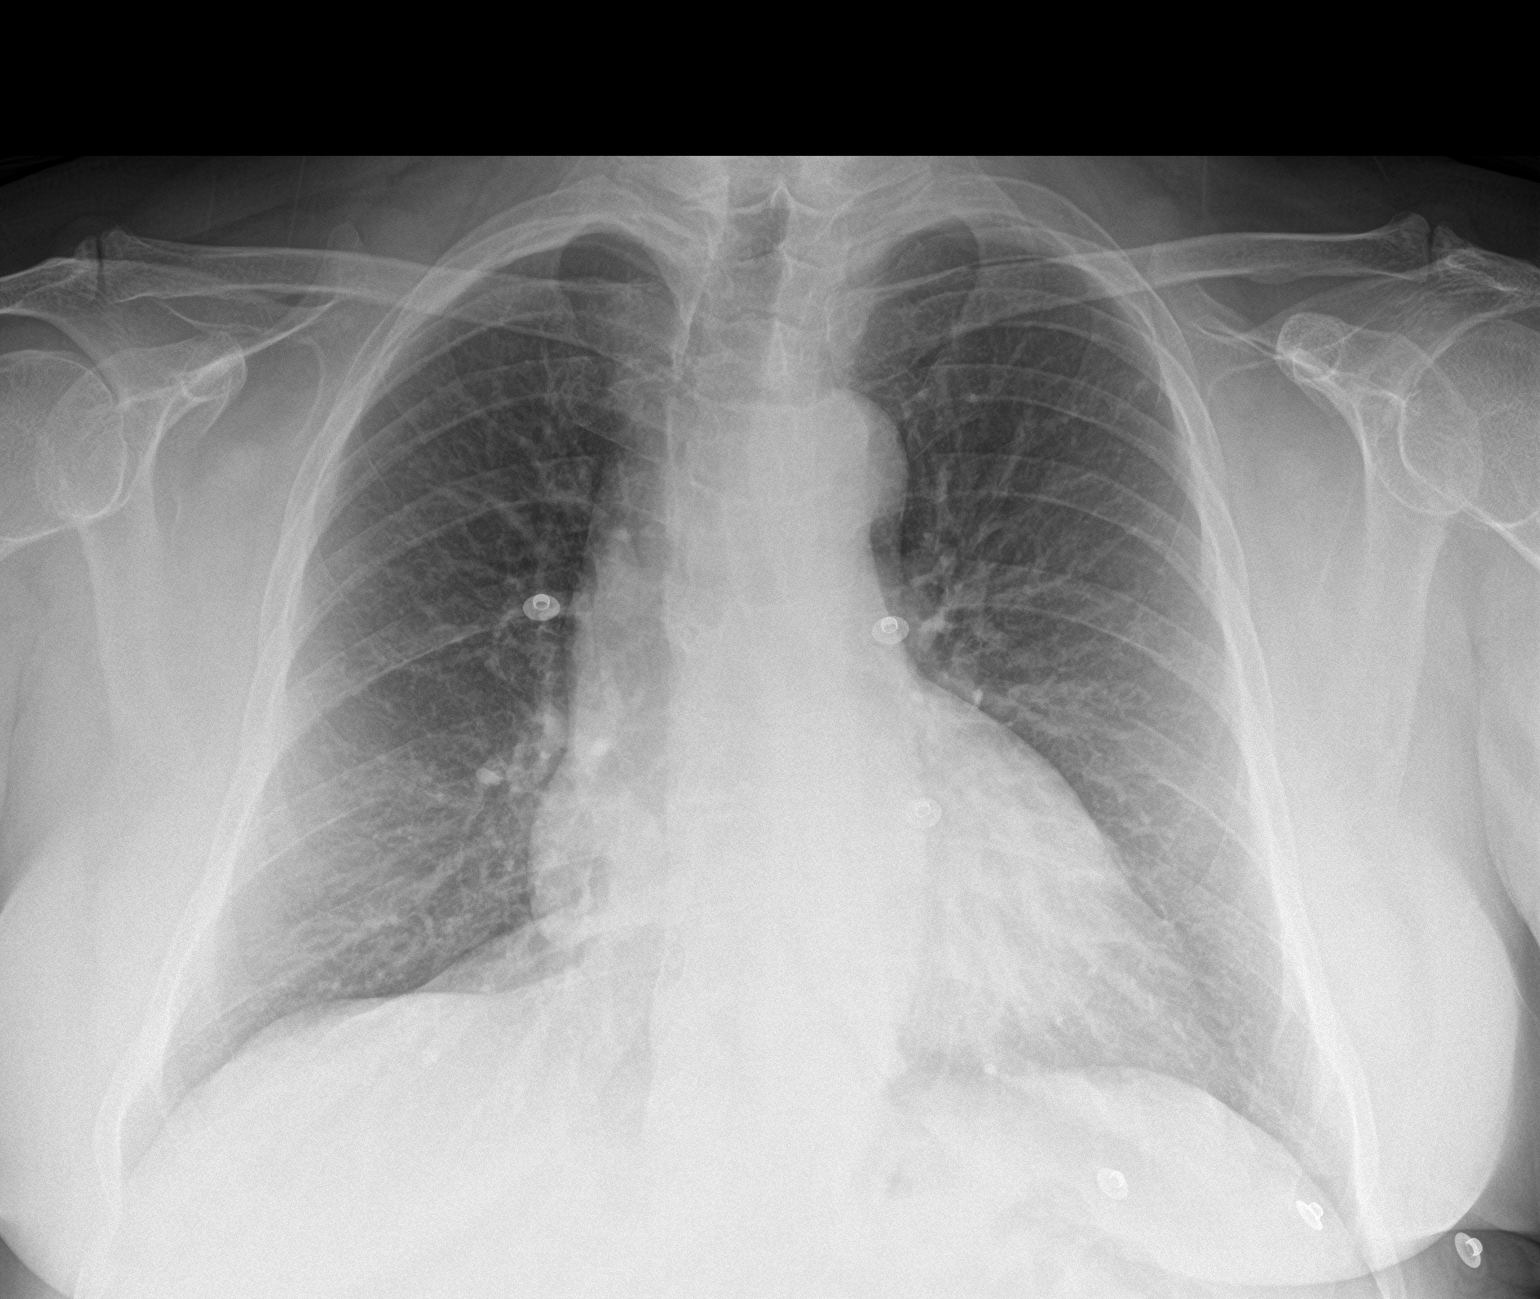

[chest lat]
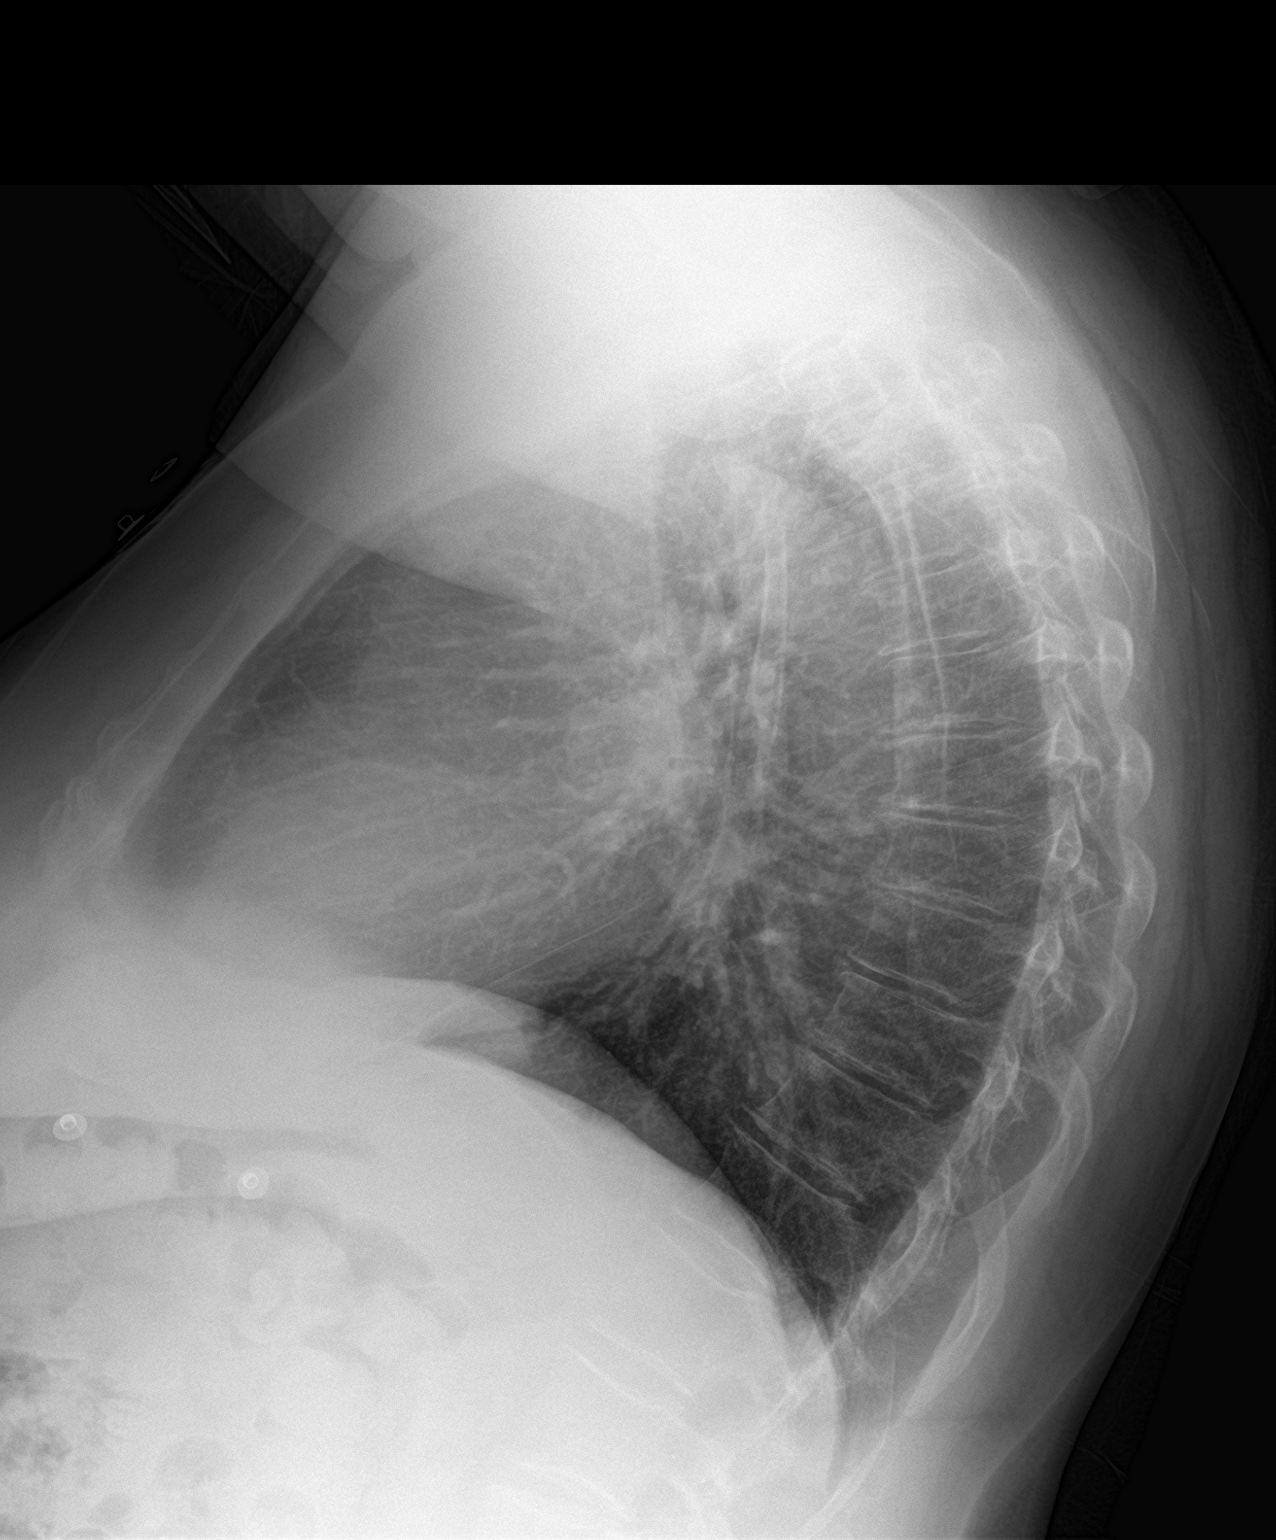

[2 of 2 positions shown; findings below may reference images not displayed]

FINDINGS: Top-normal heart size. Normal mediastinal contour. No pneumothorax.
No pleural effusion. Lungs appear clear, with no acute consolidative
airspace disease and no pulmonary edema.
IMPRESSION: No active cardiopulmonary disease.

## 2021-12-10 ENCOUNTER — Ambulatory Visit: Payer: BC Managed Care – PPO | Admitting: Student

## 2021-12-10 ENCOUNTER — Encounter: Payer: Self-pay | Admitting: Student

## 2021-12-10 VITALS — BP 125/78 | HR 81 | Temp 98.1°F | Resp 17 | Ht 62.0 in | Wt 205.8 lb

## 2021-12-10 DIAGNOSIS — I10 Essential (primary) hypertension: Secondary | ICD-10-CM

## 2021-12-10 DIAGNOSIS — I4719 Other supraventricular tachycardia: Secondary | ICD-10-CM

## 2021-12-10 DIAGNOSIS — R55 Syncope and collapse: Secondary | ICD-10-CM

## 2021-12-10 DIAGNOSIS — I471 Supraventricular tachycardia: Secondary | ICD-10-CM

## 2021-12-10 NOTE — Progress Notes (Signed)
Primary Physician/Referring:  Marvia Pickles, PA-C  Patient ID: Andrea Odom, female    DOB: 1960/07/08, 61 y.o.   MRN: [id number]  Chief Complaint  Patient presents with   PAC/PVC   Hypertension    6 MONTH   HPI:    Andrea Odom  is a 61 y.o. Caucasian female patient with hypertension, family history of premature coronary disease in her father at age 61 had MI, previously morbidly obese now mild to moderately obese, diagnosis of OSA about 15 to 20 years ago, presently not on any CPAP, states that with weight loss she has been sleeping well, GERD.  Originally referred for evaluation of chest pain and worsening dyspnea on exertion over the last 1 year as well as an episode of syncope in 11/2020.  Patient establish care with our office 01/11/2021 with Dr. Jacinto Halim. She underwent 2-week cardiac monitor which revealed episodes of atrial tachycardia which were asymptomatic as well as PACs and PVCs.  Patient's symptoms and monitor correlated with normal sinus rhythm and PACs.  She was subsequently started on low-dose beta-blocker therapy with metoprolol succinate 12.5 mg once daily.   Patient was last seen in the office 06/11/2021 at which time she continued to have palpitations, therefore increased metoprolol from 12.5 mg to 25 mg daily for both palpitations and uncontrolled hypertension.  She now presents for 7-month follow-up.  Patient has had no recurrence of palpitations since last office visit and blood pressure is now well controlled.  She does continue to refuse both sleep apnea evaluation as well as loop recorder implantation.  She has had no recurrence of syncope or near syncope. Denies chest pain, dyspnea, dizziness.  Denies orthopnea, PND, leg swelling.  Past Medical History:  Diagnosis Date   Abdominal pain    Acid reflux    Bilateral kidney stones    Hematuria    Hypertension    Kidney stone on left side    Left flank pain    Microscopic hematuria    Over weight    Sleep apnea     Vaginal discharge    Vaginal pain    Past Surgical History:  Procedure Laterality Date   CESAREAN SECTION     LAPAROSCOPIC HYSTERECTOMY     Family History  Problem Relation Age of Onset   Heart disease Father 37       MI   Hematuria Father    Bladder Cancer Neg Hx    Prostate cancer Neg Hx    Kidney cancer Neg Hx     Social History   Tobacco Use   Smoking status: Former    Packs/day: 0.25    Years: 3.00    Total pack years: 0.75    Types: Cigarettes    Quit date: 1980    Years since quitting: 43.5   Smokeless tobacco: Never  Substance Use Topics   Alcohol use: Yes    Comment: occasionally   Marital Status: Married  ROS  Review of Systems  Cardiovascular:  Negative for chest pain, claudication, leg swelling, near-syncope, orthopnea, palpitations, paroxysmal nocturnal dyspnea and syncope.  Respiratory:  Negative for shortness of breath.   Neurological:  Negative for dizziness.    Objective  Blood pressure 125/78, pulse 81, temperature 98.1 F (36.7 C), temperature source Temporal, resp. rate 17, height 5\' 2"  (1.575 m), weight 205 lb 12.8 oz (93.4 kg), SpO2 94 %. Body mass index is 37.64 kg/m.     12/10/2021    3:00 PM 06/11/2021  3:08 PM 06/11/2021    3:03 PM  Vitals with BMI  Height 5\' 2"   5\' 2"   Weight 205 lbs 13 oz  196 lbs 3 oz  BMI 37.63  35.88  Systolic 125 144  Diastolic 78 87 75  Pulse 81 85 83     Physical Exam Vitals reviewed.  Constitutional:      Appearance: She is obese.  Neck:     Vascular: No carotid bruit or JVD.  Cardiovascular:     Rate and Rhythm: Normal rate and regular rhythm.     Pulses: Intact distal pulses.     Heart sounds: Normal heart sounds. No murmur heard.    No gallop.  Pulmonary:     Effort: Pulmonary effort is normal.     Breath sounds: Normal breath sounds.  Musculoskeletal:     Right lower leg: No edema.     Left lower leg: No edema.     Laboratory examination:   Recent Labs    12/21/20 1403  NA  140  K 3.5  CL 104  CO2 26  GLUCOSE 86  BUN 19  CREATININE 0.80  CALCIUM 9.8  GFRNONAA >60   CrCl cannot be calculated (Patient's most recent lab result is older than the maximum 21 days allowed.).     Latest Ref Rng & Units 12/21/2020    2:03 PM  CMP  Glucose 70 - 99 mg/dL 86   BUN 6 - 20 mg/dL 19   Creatinine 12/23/20 - 1.00 mg/dL 12/23/2020   Sodium 2.42 - 3.53 mmol/L 140   Potassium 3.5 - 5.1 mmol/L 3.5   Chloride 98 - 111 mmol/L 104   CO2 22 - 32 mmol/L 26   Calcium 8.9 - 10.3 mg/dL 9.8       Latest Ref Rng & Units 12/21/2020    2:03 PM 04/12/2014   11:49 AM  CBC  WBC 4.0 - 10.5 K/uL 5.3  4.8   Hemoglobin 12.0 - 15.0 g/dL 12/23/2020  04/14/2014   Hematocrit 36.0 - 46.0 % 40.0  39.7   Platelets 150 - 400 K/uL 280  245    Lipid Panel No results for input(s): "CHOL", "TRIG", "LDLCALC", "VLDL", "HDL", "CHOLHDL", "LDLDIRECT" in the last 8760 hours. Lipid Panel  No results found for: "CHOL", "TRIG", "HDL", "CHOLHDL", "VLDL", "LDLCALC", "LDLDIRECT", "LABVLDL"   HEMOGLOBIN A1C No results found for: "HGBA1C", "MPG" TSH No results for input(s): "TSH" in the last 8760 hours.  External labs:   NA Medications and allergies  No Known Allergies   Medication prior to this encounter:   Outpatient Medications Prior to Visit  Medication Sig Dispense Refill   amLODipine (NORVASC) 5 MG tablet Take 5 mg by mouth at bedtime.     Calcium Carb-Cholecalciferol (CALCIUM 600 + D PO) Take 1 tablet by mouth daily.     Cholecalciferol (VITAMIN D3) 125 MCG (5000 UT) CAPS Take 1 capsule by mouth daily.     DULoxetine (CYMBALTA) 30 MG capsule Take 30 mg by mouth daily.     losartan-hydrochlorothiazide (HYZAAR) 100-12.5 MG tablet Take 1 tablet by mouth daily.     meclizine (ANTIVERT) 25 MG tablet Take 25 mg by mouth 3 (three) times daily as needed for dizziness.     meloxicam (MOBIC) 15 MG tablet Take 15 mg by mouth daily as needed.     metoprolol succinate (TOPROL-XL) 25 MG 24 hr tablet Take 1 tablet (25 mg  total) by mouth daily. Take with or immediately following a  meal. 90 tablet 3   Misc Natural Products (TOTAL MEMORY & FOCUS FORMULA) TABS Take 1 tablet by mouth daily.     NON FORMULARY Take 1 capsule by mouth daily. Neuropathy Support with vitamin B complex, folic acid, and Vitamin D     NON FORMULARY Take 1 capsule by mouth daily. FLEXERON advanced     omeprazole (PRILOSEC) 20 MG capsule Take 20 mg by mouth.     Probiotic Product (RA PROBIOTIC COMPLEX PO) Take 1 capsule by mouth daily.     No facility-administered medications prior to visit.     Medication list after today's encounter   Current Outpatient Medications  Medication Instructions   amLODipine (NORVASC) 5 mg, Oral, Daily at bedtime   Calcium Carb-Cholecalciferol (CALCIUM 600 + D PO) 1 tablet, Oral, Daily   Cholecalciferol (VITAMIN D3) 125 MCG (5000 UT) CAPS 1 capsule, Oral, Daily   DULoxetine (CYMBALTA) 30 mg, Oral, Daily   losartan-hydrochlorothiazide (HYZAAR) 100-12.5 MG tablet 1 tablet, Oral, Daily   meclizine (ANTIVERT) 25 mg, Oral, 3 times daily PRN   meloxicam (MOBIC) 15 mg, Oral, Daily PRN   metoprolol succinate (TOPROL-XL) 25 mg, Oral, Daily, Take with or immediately following a meal.   Misc Natural Products (TOTAL MEMORY & FOCUS FORMULA) TABS 1 tablet, Oral, Daily   NON FORMULARY 1 capsule, Oral, Daily, Neuropathy Support with vitamin B complex, folic acid, and Vitamin D   NON FORMULARY 1 capsule, Oral, Daily, FLEXERON advanced   omeprazole (PRILOSEC) 20 mg, Oral   Probiotic Product (RA PROBIOTIC COMPLEX PO) 1 capsule, Oral, Daily    Radiology:   Chest x-ray PA and lateral view 12/21/2020: Top-normal heart size. Normal mediastinal contour. No pneumothorax. No pleural effusion. Lungs appear clear, with no acute consolidative airspace disease and no pulmonary edema.  Cardiac Studies:   PCV ECHOCARDIOGRAM COMPLETE 02/13/2021 Normal LV systolic function with visual EF 55-60%. Left ventricle cavity is minimally  dilated. Moderate left ventricular hypertrophy. Normal global wall motion. Normal diastolic filling pattern, normal LAP. Left atrial cavity is moderately dilated. Mild (Grade I) aortic regurgitation. Mild (Grade I) mitral regurgitation. Mild tricuspid regurgitation. No evidence of pulmonary hypertension. RVSP measures 34 mmHg. No prior study for comparison.   PCV CARDIAC STRESS TEST 03/01/2021 Exercise treadmill stress test performed using Bruce protocol.  Patient reached 10.2 METS, and 109% of age predicted maximum heart rate.  Exercise capacity was excellent.  No chest pain reported.  Normal heart rate and hemodynamic response. Peak stress EKG difficult to interpret due to artifact. Stress EKG at 1 min into recovery showed sinus tachycardia, no significant ST-T changes. Low risk study.  Zio Patch Extended out patient EKG monitoring 14 days starting 02/13/2021: Predominant rhythm is normal sinus rhythm.  There were 19 brief atrial tachycardia episodes, longest 11.7 seconds at rate of 126 bpm.  Isolated PACs and PVCs and atrial triplets were present. There was no ventricular tachycardia or significant ventricular arrhythmia or heart block. There are 5 triggered events which correlated with sinus rhythm and supraventricular ectopics.  There were 0 diary entries.  EKG:  12/10/2021: Sinus rhythm at a rate of 69 bpm.  Normal axis.  No evidence of ischemia or underlying injury pattern.  Compared EKG 06/11/2021, no significant change  Assessment     ICD-10-CM   1. Primary hypertension  I10 EKG 12-Lead    2. Syncope and collapse  R55     3. Atrial tachycardia (HCC)  I47.1        There are no  discontinued medications.   No orders of the defined types were placed in this encounter.   Orders Placed This Encounter  Procedures   EKG 12-Lead    Recommendations:   IDONA STACH is a 61 y.o. Caucasian female patient with hypertension, family history of premature coronary disease in her  father at age 56 had MI, previously morbidly obese now mild to moderately obese, diagnosis of OSA about 15 to 20 years ago, presently not on any CPAP, states that with weight loss she has been sleeping well, GERD.  Originally referred for evaluation of chest pain and worsening dyspnea on exertion over the last 1 year as well as an episode of syncope in 11/2020.  Patient establish care with our office 01/11/2021 with Dr. Jacinto Halim. She underwent 2-week cardiac monitor which revealed episodes of atrial tachycardia which were asymptomatic as well as PACs and PVCs.  Patient's symptoms and monitor correlated with normal sinus rhythm and PACs.  She was subsequently started on low-dose beta-blocker therapy with metoprolol succinate 12.5 mg once daily.   Patient was last seen in the office 06/11/2021 at which time she continued to have palpitations, therefore increase metoprolol from 12.5 mg to 25 mg daily for both palpitations and uncontrolled hypertension.  She now presents for 75-month follow-up.  Blood pressure is now well controlled and she has had no recurrence of palpitations since last office visit.  We will continue increased dose of metoprolol succinate 25 mg once daily.  Notably patient has refused referral for sleep evaluation in the past to reevaluate sleep apnea.  She remained stable otherwise from a cardiovascular standpoint.  Follow-up in 1 year, sooner if needed.   Rayford Halsted, PA-C 12/10/2021, 3:57 PM Office: [phone]

## 2024-05-07 ENCOUNTER — Encounter: Payer: Self-pay | Admitting: Gastroenterology

## 2024-06-10 ENCOUNTER — Ambulatory Visit: Admitting: Gastroenterology

## 2024-06-10 ENCOUNTER — Encounter: Payer: Self-pay | Admitting: Gastroenterology

## 2024-06-10 ENCOUNTER — Ambulatory Visit: Payer: Self-pay | Admitting: Gastroenterology

## 2024-06-10 ENCOUNTER — Other Ambulatory Visit

## 2024-06-10 VITALS — BP 130/84 | HR 84 | Ht 60.0 in | Wt 193.0 lb

## 2024-06-10 DIAGNOSIS — Z1211 Encounter for screening for malignant neoplasm of colon: Secondary | ICD-10-CM | POA: Diagnosis not present

## 2024-06-10 DIAGNOSIS — R634 Abnormal weight loss: Secondary | ICD-10-CM | POA: Diagnosis not present

## 2024-06-10 DIAGNOSIS — R630 Anorexia: Secondary | ICD-10-CM

## 2024-06-10 DIAGNOSIS — R490 Dysphonia: Secondary | ICD-10-CM

## 2024-06-10 DIAGNOSIS — R1010 Upper abdominal pain, unspecified: Secondary | ICD-10-CM

## 2024-06-10 DIAGNOSIS — K760 Fatty (change of) liver, not elsewhere classified: Secondary | ICD-10-CM

## 2024-06-10 DIAGNOSIS — F1090 Alcohol use, unspecified, uncomplicated: Secondary | ICD-10-CM

## 2024-06-10 DIAGNOSIS — R110 Nausea: Secondary | ICD-10-CM

## 2024-06-10 DIAGNOSIS — R1011 Right upper quadrant pain: Secondary | ICD-10-CM | POA: Diagnosis not present

## 2024-06-10 DIAGNOSIS — K219 Gastro-esophageal reflux disease without esophagitis: Secondary | ICD-10-CM

## 2024-06-10 LAB — CBC WITH DIFFERENTIAL/PLATELET
Basophils Absolute: 0.1 K/uL (ref 0.0–0.1)
Basophils Relative: 0.9 % (ref 0.0–3.0)
Eosinophils Absolute: 0.2 K/uL (ref 0.0–0.7)
Eosinophils Relative: 3.3 % (ref 0.0–5.0)
HCT: 39.5 % (ref 36.0–46.0)
Hemoglobin: 13.6 g/dL (ref 12.0–15.0)
Lymphocytes Relative: 26.1 % (ref 12.0–46.0)
Lymphs Abs: 1.6 K/uL (ref 0.7–4.0)
MCHC: 34.5 g/dL (ref 30.0–36.0)
MCV: 91.9 fl (ref 78.0–100.0)
Monocytes Absolute: 0.6 K/uL (ref 0.1–1.0)
Monocytes Relative: 10.6 % (ref 3.0–12.0)
Neutro Abs: 3.6 K/uL (ref 1.4–7.7)
Neutrophils Relative %: 59.1 % (ref 43.0–77.0)
Platelets: 272 K/uL (ref 150.0–400.0)
RBC: 4.29 Mil/uL (ref 3.87–5.11)
RDW: 13.3 % (ref 11.5–15.5)
WBC: 6 K/uL (ref 4.0–10.5)

## 2024-06-10 LAB — COMPREHENSIVE METABOLIC PANEL WITH GFR
ALT: 24 U/L (ref 3–35)
AST: 18 U/L (ref 5–37)
Albumin: 4.6 g/dL (ref 3.5–5.2)
Alkaline Phosphatase: 113 U/L (ref 39–117)
BUN: 17 mg/dL (ref 6–23)
CO2: 32 meq/L (ref 19–32)
Calcium: 9.8 mg/dL (ref 8.4–10.5)
Chloride: 102 meq/L (ref 96–112)
Creatinine, Ser: 0.8 mg/dL (ref 0.40–1.20)
GFR: 78.09 mL/min
Glucose, Bld: 102 mg/dL — ABNORMAL HIGH (ref 70–99)
Potassium: 3.9 meq/L (ref 3.5–5.1)
Sodium: 140 meq/L (ref 135–145)
Total Bilirubin: 0.5 mg/dL (ref 0.2–1.2)
Total Protein: 7.1 g/dL (ref 6.0–8.3)

## 2024-06-10 LAB — LIPASE: Lipase: 24 U/L (ref 11.0–59.0)

## 2024-06-10 NOTE — Patient Instructions (Signed)
 You have been scheduled for an endoscopy. Please follow written instructions given to you at your visit today.  If you use inhalers (even only as needed), please bring them with you on the day of your procedure.  If you take any of the following medications, they will need to be adjusted prior to your procedure:   DO NOT TAKE 7 DAYS PRIOR TO TEST- Trulicity (dulaglutide) Ozempic, Wegovy (semaglutide) Mounjaro, Zepbound (tirzepatide) Bydureon Bcise (exanatide extended release)  DO NOT TAKE 1 DAY PRIOR TO YOUR TEST Rybelsus (semaglutide) Adlyxin (lixisenatide) Victoza (liraglutide) Byetta (exanatide) ___________________________________________________________________________   Please go to the lab in the basement of our building to have lab work done as you leave today. Hit B for basement when you get on the elevator.  When the doors open the lab is on your left.  We will call you with the results. Thank you.   You have been scheduled for an abdominal ultrasound at [facility] (1st floor of hospital) on Thursday June 17, 2024 at 10:00 am. Please arrive 30 minutes prior to your appointment for registration. Make certain not to have anything to eat or drink after midnight prior to your appointment. Should you need to reschedule your appointment, please contact radiology at [phone]. This test typically takes about 30 minutes to perform.   _______________________________________________________  If your blood pressure at your visit was 140/90 or greater, please contact your primary care physician to follow up on this.  _______________________________________________________  If you are age 64 or older, your body mass index should be between 23-30. Your Body mass index is 37.69 kg/m. If this is out of the aforementioned range listed, please consider follow up with your Primary Care Provider.  If you are age 9 or younger, your body mass index should be between 19-25.  Your Body mass index is 37.69 kg/m. If this is out of the aformentioned range listed, please consider follow up with your Primary Care Provider.   ________________________________________________________  The [facility] providers would like to encourage you to use MYCHART to communicate with providers for non-urgent requests or questions.  Due to long hold times on the telephone, sending your provider a message by [facility] may be a faster and more efficient way to get a response.  Please allow 48 business hours for a response.  Please remember that this is for non-urgent requests.  _______________________________________________________  [facility] is using a team-based approach to care.  Your team is made up of your doctor and two to three APPS. Our APPS (Nurse Practitioners and Physician Assistants) work with your physician to ensure care continuity for you. They are fully qualified to address your health concerns and develop a treatment plan. They communicate directly with your gastroenterologist to care for you. Seeing the Advanced Practice Practitioners on your physician's team can help you by facilitating care more promptly, often allowing for earlier appointments, access to diagnostic testing, procedures, and other specialty referrals.   Due to recent changes in healthcare laws, you may see the results of your imaging and laboratory studies on MyChart before your provider has had a chance to review them.  We understand that in some cases there may be results that are confusing or concerning to you. Not all laboratory results come back in the same time frame and the provider may be waiting for multiple results in order to interpret others.  Please give us  48 hours in order for your provider to thoroughly review all the results before contacting the  office for clarification of your results.

## 2024-06-10 NOTE — Progress Notes (Addendum)
 "  Andrea Odom [id number] 09/30/1960   Chief Complaint: Abdominal pain, GERD  Referring Provider: Loring Prentice BIRCH, PA-C Primary GI MD: Sampson  HPI: Andrea Odom is a 64 y.o. female with past medical history of HTN, HLD, sleep apnea, hysterectomy who presents today for a complaint of abdominal pain and GERD.    Patient is referred for persistent abdominal pain.  Per referral note, has had RUQ pain with questionable sludge in gallbladder on CT scan.  Labs 04/01/2024: Normal CBC, unremarkable CMP, high cholesterol and triglycerides, hemoglobin A1c 6.2, normal TSH, normal folic acid, normal vitamin D, normal vitamin B12, normal ESR, normal CRP   Discussed the use of AI scribe software for clinical note transcription with the patient, who gave verbal consent to proceed.  History of Present Illness Andrea Odom is a 64 year old female with gastroesophageal reflux disease, fatty liver disease, and obstructive sleep apnea who presents for evaluation of chronic upper abdominal pain.  Upper Abdominal Pain: - Present for approximately one year with progressive worsening - Primarily right-sided, radiates across the abdomen and into the back - Occurs daily, with episodes lasting 15-20 minutes - Triggered by bending forward or following meals, but also occurs at night, waking her from sleep - No clear relationship to bowel movements - Associated with decreased appetite, unintentional weight loss of 6-7 pounds, intermittent nausea without vomiting - Occasional subjective fevers with hot flashes and malaise - No jaundice - Tylenol used occasionally for pain; meloxicam discontinued due to stomach discomfort - Small abdominal hernia present, not repaired  Gastroesophageal Reflux Symptoms: - Reflux symptoms present for at least 40 years - Well controlled with Prilosec; breakthrough symptoms only if doses are missed or certain foods are consumed - No regular heartburn or acid reflux while on  medication - Occasional difficulty swallowing pills and sensation of strangling on saliva, but no food impaction - Change in voice attributed to prior severe reflux  Bowel Habits: - Bowel movements regular - No blood in stool, melena, or diarrhea - No prior colon cancer screening or stool testing - Probiotic used for bowel regularity  Hepatobiliary Findings: - CT scan revealed questionable gallbladder sludge - No prior gallbladder ultrasound or upper endoscopy - Blood work in November showed normal blood count and liver enzymes, with elevated cholesterol - History of fatty liver and previously mildly elevated liver enzymes, improved after starting milk thistle  Systemic and Other Relevant History: - No smoking - Consumes approximately a twelve-pack of beer per week, sometimes more; has attempted to reduce intake - Family history: first cousin with colon cancer, mother with benign colon polyps, multiple relatives with gallbladder disease - Obstructive sleep apnea present; previously used CPAP but currently not in use - Remote history of possible myocardial infarction prior to starting antihypertensive therapy - No chest pain or shortness of breath - Not on anticoagulation - Bioflex used for joint pain   Previous GI Procedures/Imaging   CT A/P 04/27/2024 1.  No acute findings in the abdomen or pelvis 2.  Nonobstructing bilateral renal calculi 3.  Scattered areas of ground glass within the lung bases may simply be due to atelectasis versus mild infection or inflammation 4.  Right lower lobe 4 mm pulmonary nodule.  Consider follow-up chest CT in 12 months to ensure stability   Past Medical History:  Diagnosis Date   Abdominal pain    Bilateral kidney stones    GERD (gastroesophageal reflux disease)    Hematuria    Hypertension  Kidney stone on left side    Left flank pain    Microscopic hematuria    Over weight    Sleep apnea    Vaginal discharge    Vaginal pain      Past Surgical History:  Procedure Laterality Date   CESAREAN SECTION     KIDNEY STONE SURGERY     LAPAROSCOPIC HYSTERECTOMY      Current Outpatient Medications  Medication Sig Dispense Refill   amLODipine (NORVASC) 5 MG tablet Take 5 mg by mouth at bedtime.     Calcium Carb-Cholecalciferol (CALCIUM 600 + D PO) Take 1 tablet by mouth daily.     Cholecalciferol (VITAMIN D3) 125 MCG (5000 UT) CAPS Take 1 capsule by mouth daily.     DULoxetine (CYMBALTA) 30 MG capsule Take 30 mg by mouth daily.     fexofenadine (ALLEGRA) 180 MG tablet Take 180 mg by mouth as needed.     losartan-hydrochlorothiazide (HYZAAR) 100-12.5 MG tablet Take 1 tablet by mouth daily.     meclizine (ANTIVERT) 25 MG tablet Take 25 mg by mouth 3 (three) times daily as needed for dizziness.     meloxicam (MOBIC) 15 MG tablet Take 15 mg by mouth daily as needed.     metoprolol  succinate (TOPROL -XL) 25 MG 24 hr tablet Take 1 tablet (25 mg total) by mouth daily. Take with or immediately following a meal. 90 tablet 3   Misc Natural Products (TOTAL MEMORY & FOCUS FORMULA) TABS Take 1 tablet by mouth daily.     NON FORMULARY Take 1 capsule by mouth daily. Neuropathy Support with vitamin B complex, folic acid, and Vitamin D     omeprazole (PRILOSEC) 20 MG capsule Take 20 mg by mouth.     Probiotic Product (RA PROBIOTIC COMPLEX PO) Take 1 capsule by mouth daily.     No current facility-administered medications for this visit.    Allergies as of 06/10/2024   (No Known Allergies)    Family History  Problem Relation Age of Onset   Dementia Mother    Diabetes Mother    Heart disease Father 45       MI   Hematuria Father    Heart attack Father    Heart disease Paternal Grandfather    Breast cancer Maternal Aunt    Bladder Cancer Neg Hx    Prostate cancer Neg Hx    Kidney cancer Neg Hx     Social History[1]   Review of Systems:    Constitutional: Unintentional weight loss of 6 to 7 pounds.  Subjective fever  intermittently Cardiovascular: No chest pain Respiratory: No SOB Gastrointestinal: See HPI and otherwise negative   Physical Exam:  Vital signs: BP 130/84 (BP Location: Left Arm, Patient Position: Sitting, Cuff Size: Normal)   Pulse 84   Ht 5' (1.524 m) Comment: height measured without shoes  Wt 193 lb (87.5 kg)   BMI 37.69 kg/m   Wt Readings from Last 3 Encounters:  06/10/24 193 lb (87.5 kg)  12/10/21 205 lb 12.8 oz (93.4 kg)  06/11/21 196 lb 3.2 oz (89 kg)    Constitutional: Pleasant, obese female in NAD, alert and cooperative Head:  Normocephalic and atraumatic.  Respiratory: Respirations even and unlabored. Lungs clear to auscultation bilaterally.  No wheezes, crackles, or rhonchi.  Cardiovascular:  Regular rate and rhythm. No murmurs. No peripheral edema. Gastrointestinal:  Soft, nondistended, nontender. No rebound or guarding. Normal bowel sounds. No appreciable masses or hepatomegaly. Rectal:  Not performed.  Neurologic:  Alert and oriented x4;  grossly normal neurologically.  Skin:   Dry and intact without significant lesions or rashes. Psychiatric: Oriented to person, place and time. Demonstrates good judgement and reason without abnormal affect or behaviors.   Assessment/Plan:   Assessment & Plan Chronic upper abdominal pain Unintentional weight loss Decreased appetite Chronic upper abdominal pain, primarily located to RUQ but can be diffuse in upper abdomen and radiate to back.  Possible biliary colic.  Patient did have a CT scan done in December at which time there was question of gallbladder sludge. Differential includes gallbladder disease, peptic ulcer disease, and other upper GI pathology.  Patient does have multiple family members with history of gallbladder disease.  - Ordered right upper quadrant ultrasound - Labs today: CBC, CMP, lipase - Scheduled upper endoscopy for peptic ulcer disease and esophageal assessment. - Advised alcohol abstinence  - Planned  follow-up post-diagnostics to review results and determine next steps.  Gastroesophageal reflux disease Hoarseness of voice Longstanding GERD well-controlled on PPI. Intermittent dysphagia suggests possible esophageal changes.  Has had vocal changes/hoarseness.  No prior EGD and has had GERD for 40 years.  - Continue Prilosec  - Scheduled upper endoscopy for esophageal inflammation, Barrett's esophagus, and peptic ulcer disease. I thoroughly discussed the procedure with the patient to include nature of the procedure, alternatives, benefits, and risks (including but not limited to bleeding, infection, perforation, anesthesia/cardiac/pulmonary complications). Patient verbalized understanding and gave verbal consent to proceed with procedure.  - If no explanation for vocal hoarseness on exam, consider ENT referral  Fatty liver Patient reports history of fatty liver diagnosis.  Drinks a 12 pack of beer weekly, sometimes more.  Most recent liver enzymes normal.  Denies alcoholism, but is trying to cut back.  - Ordered repeat liver function tests to monitor progression. - Advised alcohol abstinence - Calculate FIB-4 from labs today - Further discussion at follow up  Screening for colon cancer Patient denies prior colon cancer screening.  Interested in further discussion of colonoscopy at follow-up.  Held off on scheduling today in light of current upper GI symptoms.    Assigned to Dr. Nandigam today.   Addendum 06/11/2023:   Fibrosis 4 Score = .86 (Low risk)        Interpretation for patients with NAFLD          <1.30       -  F0-F1 (Low risk)          1.30-2.67 -  Indeterminate           >2.67      -  F3-F4 (High risk)     Validated for ages 110-65         Camie Furbish, [place] [facility] 06/10/2024, 10:46 AM  Patient Care Team: Loring Prentice JONETTA DEVONNA as PCP - General (Physician Assistant)       [1]  Social History Tobacco Use   Smoking status: Former    Current  packs/day: 0.00    Average packs/day: 0.3 packs/day for 3.0 years (0.8 ttl pk-yrs)    Types: Cigarettes    Start date: 19    Quit date: 1980    Years since quitting: 46.0   Smokeless tobacco: Never  Vaping Use   Vaping status: Never Used  Substance Use Topics   Alcohol use: Yes    Comment: 12 pack of beer a week   Drug use: No   "

## 2024-06-11 ENCOUNTER — Encounter: Payer: Self-pay | Admitting: Gastroenterology

## 2024-06-11 ENCOUNTER — Ambulatory Visit: Admitting: Gastroenterology

## 2024-06-11 DIAGNOSIS — R110 Nausea: Secondary | ICD-10-CM

## 2024-06-11 DIAGNOSIS — R1011 Right upper quadrant pain: Secondary | ICD-10-CM

## 2024-06-11 DIAGNOSIS — K219 Gastro-esophageal reflux disease without esophagitis: Secondary | ICD-10-CM

## 2024-06-11 MED ORDER — SODIUM CHLORIDE 0.9 % IV SOLN: 500.0000 mL | Freq: Once | INTRAVENOUS | Status: DC

## 2024-06-11 NOTE — Progress Notes (Signed)
 Pt recently started on on Semaglutide injections for weight loss. Took it last on 06/09/24. Procedure cancelled for today and rescheduled for 06/17/24, per policy. Instructions printed and reviewed with pt.

## 2024-06-17 ENCOUNTER — Encounter (HOSPITAL_COMMUNITY): Payer: Self-pay

## 2024-06-17 ENCOUNTER — Encounter: Payer: Self-pay | Admitting: Gastroenterology

## 2024-06-17 ENCOUNTER — Ambulatory Visit (HOSPITAL_COMMUNITY): Admission: RE | Admit: 2024-06-17 | Source: Ambulatory Visit

## 2024-06-17 ENCOUNTER — Ambulatory Visit: Admitting: Gastroenterology

## 2024-06-17 VITALS — BP 134/84 | HR 75 | Temp 97.7°F | Resp 15 | Ht 60.0 in | Wt 193.0 lb

## 2024-06-17 DIAGNOSIS — R110 Nausea: Secondary | ICD-10-CM

## 2024-06-17 DIAGNOSIS — K449 Diaphragmatic hernia without obstruction or gangrene: Secondary | ICD-10-CM

## 2024-06-17 DIAGNOSIS — K209 Esophagitis, unspecified without bleeding: Secondary | ICD-10-CM | POA: Diagnosis present

## 2024-06-17 DIAGNOSIS — R1010 Upper abdominal pain, unspecified: Secondary | ICD-10-CM

## 2024-06-17 DIAGNOSIS — K2270 Barrett's esophagus without dysplasia: Secondary | ICD-10-CM

## 2024-06-17 DIAGNOSIS — K219 Gastro-esophageal reflux disease without esophagitis: Secondary | ICD-10-CM

## 2024-06-17 DIAGNOSIS — R1011 Right upper quadrant pain: Secondary | ICD-10-CM

## 2024-06-17 MED ORDER — PANTOPRAZOLE SODIUM 20 MG PO TBEC: 40.0000 mg | DELAYED_RELEASE_TABLET | Freq: Every day | ORAL | 3 refills | Status: DC

## 2024-06-17 MED ORDER — PANTOPRAZOLE SODIUM 40 MG PO TBEC: 40.0000 mg | DELAYED_RELEASE_TABLET | Freq: Two times a day (BID) | ORAL | 3 refills | Status: AC

## 2024-06-17 MED ORDER — SODIUM CHLORIDE 0.9 % IV SOLN: 500.0000 mL | INTRAVENOUS | Status: DC

## 2024-06-17 MED ORDER — PANTOPRAZOLE SODIUM 40 MG PO TBEC: 40.0000 mg | DELAYED_RELEASE_TABLET | Freq: Every day | ORAL | 3 refills | Status: DC

## 2024-06-17 NOTE — Op Note (Signed)
 [facility] Patient Name: Andrea Odom Procedure Date: 06/17/2024 7:31 AM MRN: [id number] Endoscopist: Gustav ALONSO Mcgee , MD, [id number] Age: 64 Referring MD:  Date of Birth: 05-20-1961 Gender: Female Account #: [id number] Procedure:                Upper GI endoscopy Indications:              Epigastric abdominal pain, Abdominal pain in the                            right upper quadrant, Esophageal reflux, Anorexia,                            Weight loss Medicines:                Monitored Anesthesia Care Procedure:                Pre-Anesthesia Assessment:                           - Prior to the procedure, a History and Physical                            was performed, and patient medications and                            allergies were reviewed. The patient's tolerance of                            previous anesthesia was also reviewed. The risks                            and benefits of the procedure and the sedation                            options and risks were discussed with the patient.                            All questions were answered, and informed consent                            was obtained. Prior Anticoagulants: The patient has                            taken no anticoagulant or antiplatelet agents. ASA                            Grade Assessment: II - A patient with mild systemic                            disease. After reviewing the risks and benefits,                            the patient was deemed in satisfactory condition to  undergo the procedure.                           After obtaining informed consent, the endoscope was                            passed under direct vision. Throughout the                            procedure, the patient's blood pressure, pulse, and                            oxygen saturations were monitored continuously. The                            Olympus Scope [id number] was  introduced through the                            mouth, and advanced to the second part of duodenum.                            The upper GI endoscopy was accomplished without                            difficulty. The patient tolerated the procedure                            well. Scope In: Scope Out: Findings:                 The esophagus and gastroesophageal junction were                            examined with white light and narrow band imaging                            (NBI) from a forward view and retroflexed position.                            There were esophageal mucosal changes suggestive of                            long-segment Barrett's esophagus. These changes                            involved the mucosa at the upper extent of the                            gastric folds (36 cm from the incisors) extending                            to the Z-line (32 cm from the incisors).                            Circumferential salmon-colored  mucosa was present                            from 33 to 36 cm and one tongue of salmon-colored                            mucosa was present from 32 to 34 cm. The maximum                            longitudinal extent of these esophageal mucosal                            changes was 4 cm in length. Mucosa was biopsied                            with a cold forceps for histology randomly from 32                            to 36 cm from the incisors. One specimen bottle was                            sent to pathology.                           A 3 cm hiatal hernia was present.                           The exam of the stomach was otherwise normal.                           The cardia and gastric fundus were normal on                            retroflexion.                           The examined duodenum was normal. Complications:            No immediate complications. Estimated Blood Loss:     Estimated blood loss was  minimal. Impression:               - Esophageal mucosal changes suggestive of                            long-segment Barrett's esophagus. Biopsied.                           - 3 cm hiatal hernia.                           - Normal examined duodenum. Recommendation:           - Resume previous diet.                           - Continue present medications.                           -  Await pathology results.                           - Follow an antireflux regimen.                           - Use Protonix  (pantoprazole ) 40 mg PO BID. Rx for                            90 days with 3 refills Adams Hinch V. Elena Cothern, MD 06/17/2024 9:22:25 AM This report has been signed electronically.

## 2024-06-17 NOTE — Progress Notes (Unsigned)
 Pt's states no medical or surgical changes since previsit or office visit.

## 2024-06-17 NOTE — Progress Notes (Signed)
 Transferred to PACU via stretcher. Patient arousing to stimulation.  VSS upon leaving procedure room.

## 2024-06-17 NOTE — Progress Notes (Unsigned)
 [facility] History and Physical   Primary Care Physician:  Loring Prentice BIRCH, PA-C   Reason for Procedure:  epigastric/RUQ pain, GERD, decreased appetite, weight loss.   Plan:    EGD with possible interventions as needed     HPI: Andrea Odom is a very pleasant 64 y.o. female here for EGD for evaluation of  epigastric/RUQ pain, GERD, decreased appetite, weight loss.  Refer to note by Camie Furbish for details  The risks and benefits as well as alternatives of endoscopic procedure(s) have been discussed and reviewed.  The patient was provided an opportunity to ask questions and all were answered. The patient agreed with the plan and demonstrated an understanding of the instructions.   Past Medical History:  Diagnosis Date   Abdominal pain    Bilateral kidney stones    GERD (gastroesophageal reflux disease)    Hematuria    Hypertension    Kidney stone on left side    Left flank pain    Microscopic hematuria    Over weight    Sleep apnea    Vaginal discharge    Vaginal pain     Past Surgical History:  Procedure Laterality Date   CESAREAN SECTION     KIDNEY STONE SURGERY     LAPAROSCOPIC HYSTERECTOMY      Prior to Admission medications  Medication Sig Start Date End Date Taking? Authorizing Provider  amLODipine (NORVASC) 5 MG tablet Take 5 mg by mouth at bedtime. 01/02/21  Yes [provider]  Calcium Carb-Cholecalciferol (CALCIUM 600 + D PO) Take 1 tablet by mouth daily.   Yes [provider]  Cholecalciferol (VITAMIN D3) 125 MCG (5000 UT) CAPS Take 1 capsule by mouth daily.   Yes [provider]  DULoxetine (CYMBALTA) 30 MG capsule Take 30 mg by mouth daily.   Yes [provider]  losartan-hydrochlorothiazide (HYZAAR) 100-12.5 MG tablet Take 1 tablet by mouth daily. 11/09/20  Yes [provider]  meclizine (ANTIVERT) 25 MG tablet Take 25 mg by mouth 3 (three) times daily as needed for dizziness.   Yes [provider]  metoprolol  succinate (TOPROL -XL) 25 MG 24 hr tablet Take 1 tablet (25 mg total) by mouth daily. Take with or immediately following a meal. 06/11/21 06/17/24 Yes Cantwell, Celeste C, PA-C  Misc Natural Products (TOTAL MEMORY & FOCUS FORMULA) TABS Take 1 tablet by mouth daily.   Yes [provider]  NON FORMULARY Take 1 capsule by mouth daily. Neuropathy Support with vitamin B complex, folic acid, and Vitamin D   Yes [provider]  omeprazole (PRILOSEC) 20 MG capsule Take 20 mg by mouth.   Yes [provider]  Probiotic Product (RA PROBIOTIC COMPLEX PO) Take 1 capsule by mouth daily.   Yes [provider]  fexofenadine (ALLEGRA) 180 MG tablet Take 180 mg by mouth as needed. 02/14/24   [provider]  semaglutide-weight management (WEGOVY) 0.5 MG/0.5ML SOAJ SQ injection Inject 0.5 mg into the skin once a week.    [provider]    Current Outpatient Medications  Medication Sig Dispense Refill   amLODipine (NORVASC) 5 MG tablet Take 5 mg by mouth at bedtime.     Calcium Carb-Cholecalciferol (CALCIUM 600 + D PO) Take 1 tablet by mouth daily.     Cholecalciferol (VITAMIN D3) 125 MCG (5000 UT) CAPS Take 1 capsule by mouth daily.     DULoxetine (CYMBALTA) 30 MG capsule Take 30 mg by mouth daily.  losartan-hydrochlorothiazide (HYZAAR) 100-12.5 MG tablet Take 1 tablet by mouth daily.     meclizine (ANTIVERT) 25 MG tablet Take 25 mg by mouth 3 (three) times daily as needed for dizziness.     metoprolol  succinate (TOPROL -XL) 25 MG 24 hr tablet Take 1 tablet (25 mg total) by mouth daily. Take with or immediately following a meal. 90 tablet 3   Misc Natural Products (TOTAL MEMORY & FOCUS FORMULA) TABS Take 1 tablet by mouth daily.     NON FORMULARY Take 1 capsule by mouth daily. Neuropathy Support with vitamin B complex, folic acid, and Vitamin D     omeprazole (PRILOSEC) 20 MG capsule Take 20 mg by mouth.     Probiotic Product (RA  PROBIOTIC COMPLEX PO) Take 1 capsule by mouth daily.     fexofenadine (ALLEGRA) 180 MG tablet Take 180 mg by mouth as needed.     semaglutide-weight management (WEGOVY) 0.5 MG/0.5ML SOAJ SQ injection Inject 0.5 mg into the skin once a week.     Current Facility-Administered Medications  Medication Dose Route Frequency Provider Last Rate Last Admin   0.9 %  sodium chloride  infusion  500 mL Intravenous Once Elonda Giuliano V, MD       0.9 %  sodium chloride  infusion  500 mL Intravenous Continuous Gannon Heinzman V, MD        Allergies as of 06/17/2024   (No Known Allergies)    Family History  Problem Relation Age of Onset   Dementia Mother    Diabetes Mother    Heart disease Father 33       MI   Hematuria Father    Heart attack Father    Heart disease Paternal Grandfather    Breast cancer Maternal Aunt    Bladder Cancer Neg Hx    Prostate cancer Neg Hx    Kidney cancer Neg Hx     Social History   Socioeconomic History   Marital status: Married    Spouse name: Not on file   Number of children: 2   Years of education: Not on file   Highest education level: Not on file  Occupational History   Occupation: shipping and recieving  Tobacco Use   Smoking status: Former    Current packs/day: 0.00    Average packs/day: 0.3 packs/day for 3.0 years (0.8 ttl pk-yrs)    Types: Cigarettes    Start date: 72    Quit date: 1980    Years since quitting: 46.0   Smokeless tobacco: Never  Vaping Use   Vaping status: Never Used  Substance and Sexual Activity   Alcohol use: Yes    Comment: 12 pack of beer a week   Drug use: No   Sexual activity: Not on file  Other Topics Concern   Not on file  Social History Narrative   Not on file   Social Drivers of Health   Tobacco Use: Medium Risk (06/17/2024)   Patient History    Smoking Tobacco Use: Former    Smokeless Tobacco Use: Never    Passive Exposure: Not on Actuary Strain: Not on file  Food Insecurity: Not  on file  Transportation Needs: Not on file  Physical Activity: Not on file  Stress: Not on file  Social Connections: Not on file  Intimate Partner Violence: Not on file  Depression (EYV7-0): Not on file  Alcohol Screen: Not on file  Housing: Not on file  Utilities: Not on file  Health Literacy: Not  on file    Review of Systems:  All other review of systems negative except as mentioned in the HPI.  Physical Exam: Vital signs in last 24 hours: BP (!) 148/90   Pulse 80   Temp 97.7 F (36.5 C) (Skin)   Ht 5' (1.524 m)   Wt 193 lb (87.5 kg)   SpO2 97%   BMI 37.69 kg/m  General:   Alert, NAD Lungs:  Clear .   Heart:  Regular rate and rhythm Abdomen:  Soft, nontender and nondistended. Neuro/Psych:  Alert and cooperative. Normal mood and affect. A and O x 3  Reviewed labs, radiology imaging, old records and pertinent past GI work up  Patient is appropriate for planned procedure(s) and anesthesia in an ambulatory setting   K. Veena Nene Aranas , MD [phone]

## 2024-06-17 NOTE — Patient Instructions (Addendum)
 Resume previous diet Continue present medications Pick up new rx for Pantoprazole  40 mg twice daily Await pathology results  Handouts/information given for Barrett's esophagus, GERD and Hiatal Hernia  YOU HAD AN ENDOSCOPIC PROCEDURE TODAY AT THE [facility]:   Refer to the procedure report that was given to you for any specific questions about what was found during the examination.  If the procedure report does not answer your questions, please call your gastroenterologist to clarify.  If you requested that your care partner not be given the details of your procedure findings, then the procedure report has been included in a sealed envelope for you to review at your convenience later.  YOU SHOULD EXPECT: Some feelings of bloating in the abdomen. Passage of more gas than usual.  Walking can help get rid of the air that was put into your GI tract during the procedure and reduce the bloating. If you had a lower endoscopy (such as a colonoscopy or flexible sigmoidoscopy) you may notice spotting of blood in your stool or on the toilet paper. If you underwent a bowel prep for your procedure, you may not have a normal bowel movement for a few days.  Please Note:  You might notice some irritation and congestion in your nose or some drainage.  This is from the oxygen used during your procedure.  There is no need for concern and it should clear up in a day or so.  SYMPTOMS TO REPORT IMMEDIATELY:  Following upper endoscopy (EGD)  Vomiting of blood or coffee ground material  New chest pain or pain under the shoulder blades  Painful or persistently difficult swallowing  New shortness of breath  Fever of 100F or higher  Black, tarry-looking stools For urgent or emergent issues, a gastroenterologist can be reached at any hour by calling [phone]. Do not use MyChart messaging for urgent concerns.   DIET:  We do recommend a small meal at first, but then you may proceed to your regular  diet.  Drink plenty of fluids but you should avoid alcoholic beverages for 24 hours.  ACTIVITY:  You should plan to take it easy for the rest of today and you should NOT DRIVE or use heavy machinery until tomorrow (because of the sedation medicines used during the test).    FOLLOW UP: Our staff will call the number listed on your records the next business day following your procedure.  We will call around 7:15- 8:00 am to check on you and address any questions or concerns that you may have regarding the information given to you following your procedure. If we do not reach you, we will leave a message.     If any biopsies were taken you will be contacted by phone or by letter within the next 1-3 weeks.  Please call us  at [phone] if you have not heard about the biopsies in 3 weeks.    SIGNATURES/CONFIDENTIALITY: You and/or your care partner have signed paperwork which will be entered into your electronic medical record.  These signatures attest to the fact that that the information above on your After Visit Summary has been reviewed and is understood.  Full responsibility of the confidentiality of this discharge information lies with you and/or your care-partner.

## 2024-06-17 NOTE — Progress Notes (Unsigned)
 Called to room to assist during endoscopic procedure.  Patient ID and intended procedure confirmed with present staff. Received instructions for my participation in the procedure from the performing physician.

## 2024-06-18 ENCOUNTER — Telehealth: Payer: Self-pay

## 2024-06-18 NOTE — Telephone Encounter (Signed)
" °  Follow up Call-     06/17/2024    7:43 AM  Call back number  Post procedure Call Back phone  # [phone]  Permission to leave phone message Yes     Patient questions:  Do you have a fever, pain , or abdominal swelling? No. Pain Score  0 *  Have you tolerated food without any problems? Yes.    Have you been able to return to your normal activities? Yes.    Do you have any questions about your discharge instructions: Diet   No. Medications  No. Follow up visit  No.  Do you have questions or concerns about your Care? No.  Actions: * If pain score is 4 or above: No action needed, pain <4.   "

## 2024-06-22 LAB — SURGICAL PATHOLOGY

## 2024-06-24 ENCOUNTER — Ambulatory Visit: Payer: Self-pay | Admitting: Gastroenterology

## 2024-07-10 ENCOUNTER — Ambulatory Visit (HOSPITAL_COMMUNITY)
